# Patient Record
Sex: Male | Born: 1960 | Race: Black or African American | Hispanic: No | Marital: Married | Smoking: Former smoker
Health system: Southern US, Community
[De-identification: ages and names within clinical notes are randomized; demographics above are authoritative.]

## PROBLEM LIST (undated history)

## (undated) DIAGNOSIS — I10 Essential (primary) hypertension: Secondary | ICD-10-CM

## (undated) DIAGNOSIS — M1990 Unspecified osteoarthritis, unspecified site: Secondary | ICD-10-CM

## (undated) DIAGNOSIS — E119 Type 2 diabetes mellitus without complications: Secondary | ICD-10-CM

## (undated) DIAGNOSIS — I639 Cerebral infarction, unspecified: Secondary | ICD-10-CM

## (undated) DIAGNOSIS — R519 Headache, unspecified: Secondary | ICD-10-CM

## (undated) DIAGNOSIS — R51 Headache: Secondary | ICD-10-CM

## (undated) DIAGNOSIS — R079 Chest pain, unspecified: Secondary | ICD-10-CM

## (undated) DIAGNOSIS — M542 Cervicalgia: Secondary | ICD-10-CM

## (undated) HISTORY — DX: Headache: R51

## (undated) HISTORY — DX: Chest pain, unspecified: R07.9

## (undated) HISTORY — DX: Cerebral infarction, unspecified: I63.9

## (undated) HISTORY — PX: OTHER SURGICAL HISTORY: SHX169

## (undated) HISTORY — DX: Headache, unspecified: R51.9

## (undated) HISTORY — DX: Cervicalgia: M54.2

---

## 2000-10-07 ENCOUNTER — Encounter: Payer: Self-pay | Admitting: Urology

## 2000-10-07 ENCOUNTER — Encounter: Admission: RE | Admit: 2000-10-07 | Discharge: 2000-10-07 | Payer: Self-pay | Admitting: Urology

## 2000-10-10 ENCOUNTER — Ambulatory Visit (HOSPITAL_COMMUNITY): Admission: RE | Admit: 2000-10-10 | Discharge: 2000-10-10 | Payer: Self-pay | Admitting: Urology

## 2004-08-09 ENCOUNTER — Emergency Department: Payer: Self-pay | Admitting: Emergency Medicine

## 2004-10-31 ENCOUNTER — Ambulatory Visit: Payer: Self-pay | Admitting: Cardiology

## 2004-11-03 ENCOUNTER — Ambulatory Visit: Payer: Self-pay | Admitting: Cardiology

## 2006-12-16 ENCOUNTER — Emergency Department: Payer: Self-pay | Admitting: Emergency Medicine

## 2012-10-08 ENCOUNTER — Encounter (HOSPITAL_COMMUNITY): Payer: Self-pay | Admitting: Emergency Medicine

## 2012-10-08 ENCOUNTER — Emergency Department (HOSPITAL_COMMUNITY): Payer: Worker's Compensation

## 2012-10-08 ENCOUNTER — Other Ambulatory Visit: Payer: Self-pay

## 2012-10-08 ENCOUNTER — Emergency Department (HOSPITAL_COMMUNITY)
Admission: EM | Admit: 2012-10-08 | Discharge: 2012-10-08 | Disposition: A | Discharge status: Home / Self Care | Payer: Worker's Compensation | Attending: Emergency Medicine | Admitting: Emergency Medicine

## 2012-10-08 DIAGNOSIS — Z96619 Presence of unspecified artificial shoulder joint: Secondary | ICD-10-CM | POA: Insufficient documentation

## 2012-10-08 DIAGNOSIS — S8990XA Unspecified injury of unspecified lower leg, initial encounter: Secondary | ICD-10-CM | POA: Insufficient documentation

## 2012-10-08 DIAGNOSIS — S20219A Contusion of unspecified front wall of thorax, initial encounter: Secondary | ICD-10-CM | POA: Insufficient documentation

## 2012-10-08 DIAGNOSIS — Z79899 Other long term (current) drug therapy: Secondary | ICD-10-CM | POA: Insufficient documentation

## 2012-10-08 DIAGNOSIS — E119 Type 2 diabetes mellitus without complications: Secondary | ICD-10-CM | POA: Insufficient documentation

## 2012-10-08 DIAGNOSIS — Y92410 Unspecified street and highway as the place of occurrence of the external cause: Secondary | ICD-10-CM | POA: Insufficient documentation

## 2012-10-08 DIAGNOSIS — Z8739 Personal history of other diseases of the musculoskeletal system and connective tissue: Secondary | ICD-10-CM | POA: Insufficient documentation

## 2012-10-08 DIAGNOSIS — Y9389 Activity, other specified: Secondary | ICD-10-CM | POA: Insufficient documentation

## 2012-10-08 DIAGNOSIS — IMO0002 Reserved for concepts with insufficient information to code with codable children: Secondary | ICD-10-CM | POA: Insufficient documentation

## 2012-10-08 DIAGNOSIS — M436 Torticollis: Secondary | ICD-10-CM

## 2012-10-08 DIAGNOSIS — I10 Essential (primary) hypertension: Secondary | ICD-10-CM | POA: Insufficient documentation

## 2012-10-08 DIAGNOSIS — M545 Low back pain: Secondary | ICD-10-CM

## 2012-10-08 DIAGNOSIS — F17200 Nicotine dependence, unspecified, uncomplicated: Secondary | ICD-10-CM | POA: Insufficient documentation

## 2012-10-08 DIAGNOSIS — S99929A Unspecified injury of unspecified foot, initial encounter: Secondary | ICD-10-CM | POA: Insufficient documentation

## 2012-10-08 DIAGNOSIS — M79671 Pain in right foot: Secondary | ICD-10-CM

## 2012-10-08 DIAGNOSIS — M25571 Pain in right ankle and joints of right foot: Secondary | ICD-10-CM

## 2012-10-08 HISTORY — DX: Essential (primary) hypertension: I10

## 2012-10-08 HISTORY — DX: Unspecified osteoarthritis, unspecified site: M19.90

## 2012-10-08 HISTORY — DX: Type 2 diabetes mellitus without complications: E11.9

## 2012-10-08 LAB — TROPONIN I: Troponin I: <0.3 ng/mL (ref <0.30)

## 2012-10-08 LAB — GLUCOSE, CAPILLARY: Glucose-Capillary: 281 mg/dL — ABNORMAL HIGH (ref 70–99)

## 2012-10-08 MED ORDER — METHOCARBAMOL 500 MG PO TABS
ORAL_TABLET | ORAL | Status: AC
  Administered 2012-10-08: 18:00:00
  Filled 2012-10-08: qty 6

## 2012-10-08 MED ORDER — METHOCARBAMOL 500 MG PO TABS: ORAL_TABLET | ORAL | Status: DC

## 2012-10-08 MED ORDER — CYCLOBENZAPRINE HCL 10 MG PO TABS
10.0000 mg | ORAL_TABLET | Freq: Once | ORAL | Status: AC
  Administered 2012-10-08: 10 mg via ORAL
  Filled 2012-10-08: qty 1

## 2012-10-08 MED ORDER — IBUPROFEN 400 MG PO TABS
400.0000 mg | ORAL_TABLET | Freq: Once | ORAL | Status: AC
  Administered 2012-10-08: 400 mg via ORAL
  Filled 2012-10-08: qty 1
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
## 2015-02-15 ENCOUNTER — Encounter: Payer: Self-pay | Admitting: Neurology

## 2015-02-15 ENCOUNTER — Ambulatory Visit (INDEPENDENT_AMBULATORY_CARE_PROVIDER_SITE_OTHER): Payer: BC Managed Care – PPO | Admitting: Neurology

## 2015-02-15 VITALS — BP 138/82 | HR 80 | Resp 20 | Ht 72.84 in | Wt 225.0 lb

## 2015-02-15 DIAGNOSIS — I63311 Cerebral infarction due to thrombosis of right middle cerebral artery: Secondary | ICD-10-CM | POA: Diagnosis not present

## 2015-02-15 DIAGNOSIS — G4733 Obstructive sleep apnea (adult) (pediatric): Secondary | ICD-10-CM | POA: Insufficient documentation

## 2015-02-15 DIAGNOSIS — E669 Obesity, unspecified: Secondary | ICD-10-CM | POA: Insufficient documentation

## 2015-02-15 DIAGNOSIS — I10 Essential (primary) hypertension: Secondary | ICD-10-CM | POA: Diagnosis not present

## 2015-02-15 DIAGNOSIS — E1110 Type 2 diabetes mellitus with ketoacidosis without coma: Secondary | ICD-10-CM | POA: Insufficient documentation

## 2015-02-15 DIAGNOSIS — E1310 Other specified diabetes mellitus with ketoacidosis without coma: Secondary | ICD-10-CM | POA: Diagnosis not present

## 2015-02-15 DIAGNOSIS — E119 Type 2 diabetes mellitus without complications: Secondary | ICD-10-CM | POA: Diagnosis not present

## 2015-02-15 MED ORDER — CLOPIDOGREL BISULFATE 75 MG PO TABS: 75.0000 mg | ORAL_TABLET | Freq: Every day | ORAL | Status: DC

## 2015-02-15 NOTE — Progress Notes (Signed)
Provider:  Melvyn Novas, M D  Referring Provider: Ignatius Specking., MD Primary Care Physician:  Earl Many, MD  Chief Complaint  Patient presents with  . Cerebrovascular Accident    rm 10, new patient, with wife    HPI:  Eric Glass is a 54 y.o. male, left handed,  seen here as a referral from Dr. Sherril Croon for stroke follow-up.  Eric Glass and his sister are here today for a first visit with neurology outpatient care. [name] carries a diagnosis of diabetes but he is not insulin-dependent he takes Inderal,, Glucotrol and metformin, he has hypertension controlled on metoprolol amlodipine and Benzapril and he takes dexilant. [name] presented on 02-09-15 to the emergency department at [facility] in [place], [place]. His emergency room physician noted a tongue deviation to the left a drift in the left lower extremity jerky movements in the left leg. The NIH stroke scale was obtained at 6 points total. Minor facial paralysis left arm drift left leg drift limb ataxia and mild sensory loss. This was a telemetry neurology consultation. The patient was positive for half window a half hour window for TPA use and a CT angiogram showed no evidence of large vessel obstruction. So a thrombectomy was also ruled out. He stayed in the hospital for rehabilitation and observation and was placed on aspirin only. He also seemed to improve slightly already by 8 PM. Aspirin was exchanged for Plavix and the orders after the patient made the physicians aware of his hiatal hernia.  His lab results showed a low potassium of 3.2, a high anion gap, high ALT at 44 units per liter, low MCH and MCHC, high MPV, he had some a mild eosinophilia 500/mcL.  The patient for the chief complaint of dysarthria. Diagnosis was an acute cerebrovascular accident infarction of the right hemisphere, hypokalemia, uncontrolled diabetes and uncontrolled hypertension , listed as comorbidities.  There is no  MRI report available here. The patient is left hand dominant   He was discharged last Friday on the 18th of June. First outpatient appointment. He states he had an MRI of the brain and brain vessels on Wednesday prior to his discharge.        Review of Systems: Out of a complete 14 system review, the patient complains of only the following symptoms, and all other reviewed systems are negative. Snoring, GERD, left sided clumsiness. visi on , dizziness.  No diplopia, nausea, malaise or fever.   History   Social History  . Marital Status: Married    Spouse Name: N/A  . Number of Children: N/A  . Years of Education: N/A   Occupational History  . Not on file.   Social History Main Topics  . Smoking status: Former Smoker    Quit date: 02/09/2015  . Smokeless tobacco: Not on file  . Alcohol Use: No  . Drug Use: No  . Sexual Activity: Not on file   Other Topics Concern  . Not on file   Social History Narrative   Drinks 1 cup of caffeine daily.    Family History  Problem Relation Age of Onset  . Stroke Father   . Heart disease Father     Past Medical History  Diagnosis Date  . Diabetes mellitus without complication   . Hypertension   . Arthritis   . CVA (cerebral infarction)   . Chest pain   . Headache   . Neck pain  Past Surgical History  Procedure Laterality Date  . Rotator cuff replacement Left     Current Outpatient Prescriptions  Medication Sig Dispense Refill  . amLODipine-benazepril (LOTREL) 10-20 MG per capsule Take 1 capsule by mouth daily.    . canagliflozin (INVOKANA) 300 MG TABS tablet Take 300 mg by mouth daily before breakfast.    . clopidogrel (PLAVIX) 75 MG tablet Take 75 mg by mouth daily.    . Coenzyme Q10 (CO Q 10 PO) Take 1 tablet by mouth every morning.    Marland Kitchen dexlansoprazole (DEXILANT) 60 MG capsule Take 60 mg by mouth daily.    Marland Kitchen glimepiride (AMARYL) 4 MG tablet Take 4 mg by mouth daily before breakfast.    . glipiZIDE (GLUCOTROL) 5  MG tablet Take 5 mg by mouth 2 (two) times daily before a meal.    . metFORMIN (GLUCOPHAGE) 500 MG tablet Take 500 mg by mouth 2 (two) times daily.    . methocarbamol (ROBAXIN) 500 MG tablet Take 1 or 2 po Q 6hrs for pain 6 tablet 0  . metoprolol (LOPRESSOR) 100 MG tablet Take 100 mg by mouth daily.    . Multiple Vitamin (MULTIVITAMIN WITH MINERALS) TABS Take 1 tablet by mouth every morning.     No current facility-administered medications for this visit.    Allergies as of 02/15/2015 - Review Complete 02/15/2015  Allergen Reaction Noted  . Bee venom Anaphylaxis 10/08/2012    Vitals: BP 138/82 mmHg  Pulse 80  Resp 20  Ht 6' 0.83" (1.85 m)  Wt 225 lb (102.059 kg)  BMI 29.82 kg/m2 Last Weight:  Wt Readings from Last 1 Encounters:  02/15/15 225 lb (102.059 kg)   Last Height:   Ht Readings from Last 1 Encounters:  02/15/15 6' 0.83" (1.85 m)    Physical exam:  General: The patient is awake, alert and appears not in acute distress. The patient is well groomed. Head: Normocephalic, atraumatic.  Neck is supple. Mallampati 4 , neck circumference:18.25  Cardiovascular:  Regular rate and rhythm, without  murmurs or carotid bruit, and without distended neck veins. Respiratory: Lungs are clear to auscultation. Skin:  Without evidence of edema, or rash Trunk: BMI is elevated and patient  has normal posture.  Neurologic exam : The patient is awake and alert, oriented to place and time.  Memory subjective  described as intact. There is a normal attention span & concentration ability. Speech is fluent without dysarthria, dysphonia or aphasia. Mood and affect are appropriate.  Cranial nerves: Pupils are equal and briskly reactive to light. Funduscopic exam without  evidence of pallor or edema. Extraocular movements  in vertical and horizontal planes intact and without nystagmus.  Visual fields by finger perimetry are intact. Hearing to finger rub intact.  Facial sensation intact to fine  touch. Facial motor strength is asymmetric, the left face has a lower droop, asymmetric smile.  The  tongue and uvula move midline.  Tongue protrusion into either cheek is normal. Shoulder shrug is normal.   Motor exam: Normal tone, muscle bulk . The patient has clearly a pronation drift on the left arm,   Has slowing of rapid alternating movements and loss of grip strength in the left. DTR are equall, but left toe up-going.  Sensory:  Fine touch, pinprick and vibration were tested in all extremities. Proprioception was normal.  Coordination: Rapid alternating movements in the fingers/hands were slowed on the left . Finger-to-nose maneuver  Clumsy and ataxic on the left  without evidence of  tremor.  Gait and station: Patient walks without assistive device , is very careful. He has been given a cane but is not used to it, and is able unassisted to climb up to the exam table.  Strength within normal limits. Stance is stable and normal. Tandem gait is fragmented, deferred . He could perform a toe walk, he has not been ablt to heel walk. His left food is everted. . Romberg testing is positive for drift to the left  Deep tendon reflexes: in the  upper and lower extremities are symmetric and intact ! . Babinski maneuver response is  downgoing.   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 There is also weakness of the left hand left antebrachial muscles and the left leg. Adduction and abduction seemed to be fine but hip flexion was weaker. He was unable to walk on his heels he is able to walk on his toes but has less control over his left foot. The left foot inverted during gait exam. There is a slight drift to the  left but the patient stands with his eyes closed. There is no major sensory loss. He is not numb but rather feeling heavy in his affected limbs.  This patient will need a fasting lipid profile which I could not find in his [place] records, I will obtain the MRI/MRA in copy from [facility], the patient will require physical therapy for gait stabilization and fall risk prevention, occupational therapy especially since his dominant left hand is involved and I would like him to see a speech therapist to discuss some special exercises for the lower facial muscles.  Mr. Leighton does have some obvious risk factors for stroke, he is diabetic, he is hypertensive, he is a male and he is over 47 years old.  He has recently been instructed to do some attritional changes. I would like for him to reduce his salt intake and to watch red meat and fat intake. Especially animal fat. In addition I think that he has a high risk of sleep apnea given his larger neck is high-grade Mallampati and- according to his sister -he is a thunderous snorer. His wife  wakes him when he stops breathing. He is excessively fatigued.   Plan:  Treatment plan and additional workup :  SPLIT night study,  PT, OT, ST and rehab.  The patient needs a fasting lipid profile and keeps on Plavix.      Porfirio Mylar Derion Kreiter MD 02/15/2015

## 2015-02-15 NOTE — Patient Instructions (Signed)
Ischemic Stroke Blood carries oxygen to all areas of your body. A stroke happens when your blood does not flow to your brain like normal. If this happens, your brain will not get the oxygen it needs and brain tissue will die. This is an emergency. Problems (symptoms) of a stroke usually happen suddenly. You may notice them when you wake up. They can include:  Loss of feeling or weakness on one side of the body (face, arm, leg).  Feeling confused.  Trouble talking or understanding.  Trouble seeing.  Trouble walking.  Feeling dizzy.  Loss of balance or coordination.  Severe headache without a cause.  Trouble reading or writing. Get help as soon as any of these problems first start. This is important.  RISK FACTORS  Risk factors are things that make it more likely for you to have a stroke. These things include:  High blood pressure (hypertension).  High cholesterol.  Diabetes.  Heart disease.  Having a buildup of fatty deposits in the blood vessels.  Having an abnormal heart rhythm (atrial fibrillation).  Being very overweight (obese).  Smoking.  Taking birth control pills, especially if you smoke.  **Not being active.  **Having a diet high in fats, salt, and calories.  Drinking too much alcohol.  Using illegal drugs.  Being African American.  Being over the age of 55.  Having a family history of stroke.  Having a history of blood clots, stroke, warning stroke (transient ischemic attack, TIA), or heart attack.  Sickle cell disease. HOME CARE  Take all medicines exactly as told by your doctor. Understand all your medicine instructions.  You may need to take a medicine to thin your blood, like aspirin or warfarin. Take warfarin exactly as told.  Taking too much or too little warfarin is dangerous. Get regular blood tests as told, including the PT and INR tests. The test results help your doctor adjust your dose of warfarin. Your PT and INR levels must be  done as often as told by your doctor.  Food can cause problems with warfarin and affect the results of your blood tests. This is true for foods high in vitamin K, such as spinach, kale, broccoli, cabbage, collard and turnip greens, Brussels sprouts, peas, cauliflower, seaweed, and parsley, as well as beef and pork liver, green tea, and soybean oil. Eat the same amount of food high in vitamin K. Avoid major changes in your diet. Tell your doctor before changing your diet. Talk to a food specialist (dietitian) if you have questions.  Many medicines can cause problems with warfarin and affect your PT and INR test results. Tell your doctor about all medicines you take. This includes vitamins and dietary pills (supplements). Be careful with aspirin and medicines that relieve redness, soreness, and puffiness (inflammation). Do not take or stop medicines unless your doctor tells you to.  Warfarin can cause a lot of bruising or bleeding. Hold pressure over cuts for longer than normal. Talk to your doctor about other side effects of warfarin.  Avoid sports or activities that may cause injury or bleeding.  Be careful when you shave, floss your teeth, or use sharp objects.  Avoid alcoholic drinks or drink very little alcohol while taking warfarin. Tell your doctor if you change how much alcohol you drink.  Tell your dentist and other doctors that you take warfarin before procedures.  If you are able to swallow, eat healthy foods. Eat 5 or more servings of fruits and vegetables a day. Eat soft   foods, pureed foods, or eat small bites of food so you do not choke.  Follow your diet program as told, if you are given one.  Keep a healthy weight.  Stay active. Try to get at least 30 minutes of activity on most or all days.  Do not smoke.  Limit how much alcohol you drink even if you are not taking warfarin. Moderate alcohol use is:  No more than 2 drinks each day for men.  No more than 1 drink each day  for women who are not pregnant.  Keep your home safe so you do not fall. Try:  Putting grab bars in the bedroom and bathroom.  Raising toilet seats.  Putting a seat in the shower.  Go to therapy sessions (physical, occupational, and speech) as told by your doctor.  Use a walker or cane at all times if told to do so.  Keep all doctor visits as told. GET HELP IF:  Your personality changes.  You have trouble swallowing.  You are seeing two of everything.  You are dizzy.  You have a fever.  Your skin starts to break down. GET HELP RIGHT AWAY IF:  The symptoms below may be a sign of an emergency. Do not wait to see if the symptoms go away. Call for help (911 in [place] Do not drive yourself to the hospital.  You have sudden weakness or numbness on the face, arm, or leg (especially on one side of the body).  You have sudden trouble walking or moving your arms or legs.  You have sudden confusion.  You have trouble talking or understanding.  You have sudden trouble seeing in one or both eyes.  You lose your balance or your movements are not smooth.  You have a sudden, severe headache with no known cause.  You have new chest pain or you feel your heart beating in an unsteady way.  You are partly or totally unaware of what is going on around you. Document Released: 08/02/2011 Document Revised: 12/28/2013 Document Reviewed: 03/23/2012 Exit[employer] Patient Information 2015 [employer]. This information is not intended to replace advice given to you by your health care provider. Make sure you discuss any questions you have with your health care provider.  

## 2015-02-16 ENCOUNTER — Telehealth: Payer: Self-pay

## 2015-02-16 LAB — LIPID PANEL
Chol/HDL Ratio: 9.1 ratio — ABNORMAL HIGH (ref 0.0–5.0)
Cholesterol, Total: 200 mg/dL — ABNORMAL HIGH (ref 100–199)
HDL: 22 mg/dL — ABNORMAL LOW
LDL Calculated: 142 mg/dL — ABNORMAL HIGH (ref 0–99)
Triglycerides: 182 mg/dL — ABNORMAL HIGH (ref 0–149)
VLDL Cholesterol Cal: 36 mg/dL (ref 5–40)

## 2015-02-16 NOTE — Telephone Encounter (Signed)
LVM for patient to call office back to schedule TCD [place] appointment.

## 2015-02-16 NOTE — Telephone Encounter (Signed)
Patient's wife called and requested to speak with Joy RN. Please call and advise. [phone])

## 2015-02-17 ENCOUNTER — Ambulatory Visit (INDEPENDENT_AMBULATORY_CARE_PROVIDER_SITE_OTHER): Payer: BC Managed Care – PPO

## 2015-02-17 DIAGNOSIS — I10 Essential (primary) hypertension: Secondary | ICD-10-CM

## 2015-02-17 DIAGNOSIS — I63311 Cerebral infarction due to thrombosis of right middle cerebral artery: Secondary | ICD-10-CM | POA: Diagnosis not present

## 2015-02-17 DIAGNOSIS — E1110 Type 2 diabetes mellitus with ketoacidosis without coma: Secondary | ICD-10-CM

## 2015-02-17 DIAGNOSIS — E669 Obesity, unspecified: Secondary | ICD-10-CM

## 2015-02-17 DIAGNOSIS — G4733 Obstructive sleep apnea (adult) (pediatric): Secondary | ICD-10-CM

## 2015-02-21 ENCOUNTER — Telehealth: Payer: Self-pay

## 2015-02-21 NOTE — Telephone Encounter (Signed)
Called pt to give results of lab work. No answer, left a message asking him to call me back.

## 2015-02-21 NOTE — Telephone Encounter (Signed)
-----   Message from Carmen Dohmeier, MD sent at 02/18/2015  1:50 PM EDT ----- Very high triglycerides/ low HDL cholesterol, will share with dr Vyas.  Patient suffered a stroke last week. This is a man risk factor. He has a sleep study pending after July 10th. CD 

## 2015-02-22 NOTE — Telephone Encounter (Signed)
Gave pt lab results. Counseled him on eating healthier, staying away from red meats, eggs to help lower cholesterol. Will fax to Dr. Sherril CroonVyas. Pt verbalized understanding.

## 2015-02-22 NOTE — Telephone Encounter (Signed)
Called pt again to give lab results. No answer, left a message asking him to call me back.

## 2015-02-22 NOTE — Telephone Encounter (Signed)
-----   Message from Melvyn Novasarmen Dohmeier, MD sent at 02/18/2015  1:50 PM EDT ----- Very high triglycerides/ low HDL cholesterol, will share with dr Sherril CroonVyas.  Patient suffered a stroke last week. This is a man risk factor. He has a sleep study pending after July 10th. CD

## 2015-02-24 ENCOUNTER — Ambulatory Visit (INDEPENDENT_AMBULATORY_CARE_PROVIDER_SITE_OTHER): Payer: BC Managed Care – PPO | Admitting: Neurology

## 2015-02-24 DIAGNOSIS — E669 Obesity, unspecified: Secondary | ICD-10-CM

## 2015-02-24 DIAGNOSIS — E119 Type 2 diabetes mellitus without complications: Secondary | ICD-10-CM

## 2015-02-24 DIAGNOSIS — I63311 Cerebral infarction due to thrombosis of right middle cerebral artery: Secondary | ICD-10-CM

## 2015-02-24 DIAGNOSIS — G4733 Obstructive sleep apnea (adult) (pediatric): Secondary | ICD-10-CM | POA: Diagnosis not present

## 2015-02-24 DIAGNOSIS — I10 Essential (primary) hypertension: Secondary | ICD-10-CM

## 2015-02-24 DIAGNOSIS — E1110 Type 2 diabetes mellitus with ketoacidosis without coma: Secondary | ICD-10-CM

## 2015-02-24 NOTE — Sleep Study (Signed)
Please see the scanned sleep study interpretation located in the Procedure tab within the Chart Review section. 

## 2015-03-03 ENCOUNTER — Telehealth: Payer: Self-pay | Admitting: Neurology

## 2015-03-03 NOTE — Telephone Encounter (Signed)
I called the patient, talk with the wife. The transcranial Doppler bubble study was unremarkable.

## 2015-03-15 ENCOUNTER — Ambulatory Visit (HOSPITAL_COMMUNITY): Payer: Self-pay | Admitting: Occupational Therapy

## 2015-03-15 ENCOUNTER — Telehealth: Payer: Self-pay

## 2015-03-15 NOTE — Telephone Encounter (Signed)
Called pt to inform him of sleep study results. No answer. Left message on home phone asking him to call me back.

## 2015-03-16 NOTE — Telephone Encounter (Signed)
Spoke to pt about his sleep study results. Informed pt that osa was seen in is study and the pt slept poorly even on cpap. I advised him that Dr. Vickey Hugerohmeier needs to meet in person with him to discuss the results. Pt may be a candidate for dental device or weight loss to control osa. I informed him again that Dr. Vickey Hugerohmeier prefers to start him on cpap, but he did not tolerate the cpap well. The pt wishes for an appt with Dr. Vickey Hugerohmeier. He and I made an appt for 8/31 at 9:30. Pt is not driving and his wife works so it was difficult to find a day for the pt to come in. I advised the pt that if I had a sooner appt, that I would call him, but this is not guaranteed. Pt verbalized understanding.

## 2015-03-16 NOTE — Telephone Encounter (Signed)
Pt calling back to speak with RN about sleep study results, please call and advise [phone].

## 2015-03-16 NOTE — Telephone Encounter (Signed)
Dr. Vickey Hugerohmeier had a cancellation on 8/17. I offered this spot to the pt and he agreed to come in 8/17 at 11:00.

## 2015-03-23 ENCOUNTER — Ambulatory Visit: Payer: BC Managed Care – PPO | Admitting: Neurology

## 2015-04-08 ENCOUNTER — Ambulatory Visit (INDEPENDENT_AMBULATORY_CARE_PROVIDER_SITE_OTHER): Payer: BC Managed Care – PPO | Admitting: Neurology

## 2015-04-08 ENCOUNTER — Encounter: Payer: Self-pay | Admitting: Neurology

## 2015-04-08 VITALS — BP 124/82 | HR 64 | Ht 72.0 in | Wt 219.2 lb

## 2015-04-08 DIAGNOSIS — I63311 Cerebral infarction due to thrombosis of right middle cerebral artery: Secondary | ICD-10-CM | POA: Diagnosis not present

## 2015-04-08 DIAGNOSIS — E785 Hyperlipidemia, unspecified: Secondary | ICD-10-CM | POA: Diagnosis not present

## 2015-04-08 DIAGNOSIS — Z720 Tobacco use: Secondary | ICD-10-CM | POA: Diagnosis not present

## 2015-04-08 DIAGNOSIS — E669 Obesity, unspecified: Secondary | ICD-10-CM | POA: Diagnosis not present

## 2015-04-08 DIAGNOSIS — E782 Mixed hyperlipidemia: Secondary | ICD-10-CM | POA: Insufficient documentation

## 2015-04-08 DIAGNOSIS — E119 Type 2 diabetes mellitus without complications: Secondary | ICD-10-CM | POA: Diagnosis not present

## 2015-04-08 DIAGNOSIS — I10 Essential (primary) hypertension: Secondary | ICD-10-CM

## 2015-04-08 DIAGNOSIS — E1310 Other specified diabetes mellitus with ketoacidosis without coma: Secondary | ICD-10-CM

## 2015-04-08 DIAGNOSIS — R002 Palpitations: Secondary | ICD-10-CM | POA: Diagnosis not present

## 2015-04-08 DIAGNOSIS — G4733 Obstructive sleep apnea (adult) (pediatric): Secondary | ICD-10-CM

## 2015-04-08 DIAGNOSIS — E1110 Type 2 diabetes mellitus with ketoacidosis without coma: Secondary | ICD-10-CM

## 2015-04-08 DIAGNOSIS — F17200 Nicotine dependence, unspecified, uncomplicated: Secondary | ICD-10-CM

## 2015-04-08 NOTE — Patient Instructions (Signed)
-   continue plavix and lipitor for stroke prevention - completely quit smoking - following up with Dr. Vickey Huger for sleep apnea - Follow up with your primary care physician for stroke risk factor modification. Recommend maintain blood pressure goal <130/80, diabetes with hemoglobin A1c goal below 6.5% and lipids with LDL cholesterol goal below 70 mg/dL.  - follow up with Dr. Sherril Croon today to check again A1C, lipid panel - check BP and glucose at home - No restriction for driving, but recommend to drive with family members on board for 2-3 times initially. If both parties feel comfortable of your driving, you can drive alone after. However, you are recommended to drive during the day not at night, no long distance and drive in familiar roads. - will do 2D echo and 30 day cardiac monitoring - follow up in 3 months.

## 2015-04-08 NOTE — Progress Notes (Signed)
STROKE NEUROLOGY FOLLOW UP NOTE  NAME: RAJEEV ESCUE DOB: Mar 18, 1961  REASON FOR VISIT: stroke follow up HISTORY FROM: pt and sister  Today we had the pleasure of seeing GERONIMO DILIBERTO in follow-up at our Neurology Clinic. Pt was accompanied by sister and wife.   History Summary BOE DEANS is a 54 y.o. male, left handed,with PMH of DMH. HLD, HTN, smoker and OSA followed up in clinic for stroke. He was at work on 02/09/2015, round 9am, he was not able to walk, he thought that was due to hot weather, he rested for a few minutes and he felt was back to normal. Around 12 PM, he was eating lunch, found to have left facial droop left-sided drooling, he took a nap and after waking up he felt okay. Around 4 PM, he started to have blurry vision lasted for 30 minutes, while driving home, he found left arm weakness, as well as left leg weakness with slurry speech. He went to [facility], CT of the head did not show any large vessel occlusion, carotid Doppler negative, MRI showed right basal ganglion stroke. He was discharged with Plavix and Lipitor.   He then follow up in our clinic with Dr. Golden Hurter, had TCD and bladder monitoring which was negative, also had a sleep study done showed OSA, and the currently undergoing CPAP treatment. Checked A1c 6.1 and LDL 142. He was continued on Plavix and Lipitor.  Interval History During the interval time, the patient has been doing well.  No recurrent stroke like symptoms. He has quit smoking since the admission. Stated that he is compliant with medications, blood pressure and glucose check at home were satisfactory. Blood pressure in clinic today 124/82.  REVIEW OF SYSTEMS: Full 14 system review of systems performed and notable only for those listed below and in HPI above, all others are negative:  Constitutional:  Activity change, appetite change Cardiovascular:  Ear/Nose/Throat:  Hearing loss, drooling Skin:  Eyes:  Loss of vision Respiratory:    Gastroitestinal:   Genitourinary:  Hematology/Lymphatic:   Endocrine: Heat intolerance Musculoskeletal:   Allergy/Immunology:   Neurological:  Dizziness, numbness, speech difficulty, facial drooping Psychiatric: Nervous, anxious Sleep: Daytime sleepiness, snoring  The following represents the patient's updated allergies and side effects list: Allergies  Allergen Reactions  . Bee Venom Anaphylaxis    EPIPEN required    The neurologically relevant items on the patient's problem list were reviewed on today's visit.  Neurologic Examination  A problem focused neurological exam (12 or more points of the single system neurologic examination, vital signs counts as 1 point, cranial nerves count for 8 points) was performed.  Blood pressure 124/82, pulse 64, height 6' (1.829 m), weight 219 lb 3.2 oz (99.428 kg).  General - Well nourished, well developed, in no apparent distress.  Ophthalmologic - Sharp disc margins OU.  Cardiovascular - Regular rate and rhythm with no murmur.  Mental Status -  Level of arousal and orientation to time, place, and person were intact. Language including expression, naming, repetition, comprehension was assessed and found intact. Fund of Knowledge was assessed and was intact.  Cranial Nerves II - XII - II - Visual field intact OU. III, IV, VI - Extraocular movements intact. V - Facial sensation intact bilaterally. VII - left facial droop. VIII - Hearing & vestibular intact bilaterally. X - Palate elevates symmetrically. XI - Chin turning & shoulder shrug intact bilaterally. XII - Tongue protrusion intact.  Motor Strength - The patient's strength was  normal in all extremities except mild left hand dexterity difficulties and pronator drift was absent.  Bulk was normal and fasciculations were absent.   Motor Tone - Muscle tone was assessed at the neck and appendages and was normal.  Reflexes - The patient's reflexes were 1+ in all extremities and he  had no pathological reflexes.  Sensory - Light touch, temperature/pinprick, vibration and proprioception were assessed and were normal. Romberg testing showed teetering but no fall.   Coordination - The patient had normal movements in the hands and feet with no ataxia or dysmetria.  Tremor was absent.  Gait and Station - The patient's transfers, posture, gait, station, and turns were observed as normal.  Data reviewed: I personally reviewed the images and agree with the radiology interpretations.  MRI 02/10/15 - acute right MCA territory infarct, mainly involve right lentiform nuclei  CTA head 02/10/15 - no large vessel occlusion  Carotid Doppler - negative for bilateral ICA stenosis  TCD emboli detection - negative for microemboli  Sleep study - positive for OSA  Component     Latest Ref Rng 02/15/2015  Cholesterol, Total     100 - 199 mg/dL 161 (H)  Triglycerides     0 - 149 mg/dL 096 (H)  HDL Cholesterol     >39 mg/dL 22 (L)  VLDL Cholesterol Cal     5 - 40 mg/dL 36  LDL (calc)     0 - 99 mg/dL 045 (H)  Total CHOL/HDL Ratio     0.0 - 5.0 ratio units 9.1 (H)   A1c 6.1  Assessment: As you may recall, he is a 54 y.o. African American male with PMH of diabetes, hypertension, hyperlipidemia, smoker, OSA followed up in clinic for right MCA territory infarct occurred in 01/2015. Stroke location consistent with small vessel disease, however size a little bit bigger than normal lacunar infarct. Patient has significant stroke risk factors including HTN, DM, HLD, OSA, smoker, and obesity. Will finish off stroke workup with 2-D echo. However, also recommend to have surgery day cardiac event monitoring to rule out A. fib due to size of stroke.  Plan:  - continue plavix and lipitor for stroke prevention - Continue to abstain from smoking - following up with Dr. Vickey Huger for sleep apnea - Follow up with your primary care physician for stroke risk factor modification. Recommend maintain  blood pressure goal <130/80, diabetes with hemoglobin A1c goal below 6.5% and lipids with LDL cholesterol goal below 70 mg/dL.  - follow up with Dr. Sherril Croon today to check again A1C, lipid panel - check BP and glucose at home - will perform 2D echo and 30 day cardiac monitoring - RTC in 3 months.  Orders Placed This Encounter  Procedures  . Cardiac event monitor    Standing Status: Future     Number of Occurrences:      Standing Expiration Date: 04/08/2016    Scheduling Instructions:     Request cardionet setup. Thank you.    Order Specific Question:  Where should this test be performed?    Answer:  [facility]  . ECHOCARDIOGRAM COMPLETE    Standing Status: Future     Number of Occurrences:      Standing Expiration Date: 07/07/2016    Order Specific Question:  Where should this test be performed    Answer:  [facility])    Order Specific Question:  Complete or Limited study?    Answer:  Complete    Order  Specific Question:  With Image Enhancing Agent or without Image Enhancing Agent?    Answer:  With Image Enhancing Agent    Order Specific Question:  Reason for exam-Echo    Answer:  Stroke  434.91 / I163.9    Meds ordered this encounter  Medications  . atorvastatin (LIPITOR) 10 MG tablet    Sig:   . GLIPIZIDE XL 10 MG 24 hr tablet    Sig:   . DISCONTD: meclizine (ANTIVERT) 25 MG tablet    Sig:   . DISCONTD: TOPROL XL 100 MG 24 hr tablet    Sig:     Patient Instructions  - continue plavix and lipitor for stroke prevention - completely quit smoking - following up with Dr. Vickey Huger for sleep apnea - Follow up with your primary care physician for stroke risk factor modification. Recommend maintain blood pressure goal <130/80, diabetes with hemoglobin A1c goal below 6.5% and lipids with LDL cholesterol goal below 70 mg/dL.  - follow up with Dr. Sherril Croon today to check again A1C, lipid panel - check BP and glucose at home - No restriction for driving, but  recommend to drive with family members on board for 2-3 times initially. If both parties feel comfortable of your driving, you can drive alone after. However, you are recommended to drive during the day not at night, no long distance and drive in familiar roads. - will do 2D echo and 30 day cardiac monitoring - follow up in 3 months.    Marvel Plan, MD PhD [facility] [address] [place], [place] [address] [phone]

## 2015-04-12 ENCOUNTER — Other Ambulatory Visit: Payer: Self-pay | Admitting: Neurology

## 2015-04-12 DIAGNOSIS — R002 Palpitations: Secondary | ICD-10-CM

## 2015-04-12 DIAGNOSIS — I4891 Unspecified atrial fibrillation: Secondary | ICD-10-CM

## 2015-04-12 DIAGNOSIS — I63511 Cerebral infarction due to unspecified occlusion or stenosis of right middle cerebral artery: Secondary | ICD-10-CM

## 2015-04-13 ENCOUNTER — Ambulatory Visit (INDEPENDENT_AMBULATORY_CARE_PROVIDER_SITE_OTHER): Payer: BC Managed Care – PPO | Admitting: Neurology

## 2015-04-13 ENCOUNTER — Encounter: Payer: Self-pay | Admitting: Neurology

## 2015-04-13 VITALS — BP 131/85 | HR 66 | Temp 97.6°F | Resp 17 | Ht 72.0 in | Wt 216.5 lb

## 2015-04-13 DIAGNOSIS — R0683 Snoring: Secondary | ICD-10-CM

## 2015-04-13 DIAGNOSIS — Z789 Other specified health status: Secondary | ICD-10-CM | POA: Insufficient documentation

## 2015-04-13 DIAGNOSIS — G4733 Obstructive sleep apnea (adult) (pediatric): Secondary | ICD-10-CM | POA: Diagnosis not present

## 2015-04-13 NOTE — Progress Notes (Deleted)
STROKE NEUROLOGY FOLLOW UP NOTE  NAME: Eric Glass DOB: 1961-03-17  REASON FOR VISIT: stroke follow up HISTORY FROM: pt and sister  Today we had the pleasure of seeing Eric Glass in follow-up at our Neurology Clinic. Pt was accompanied by sister and wife.   History Summary Eric Glass is a 54 y.o. male, left handed,with PMH of DMH. HLD, HTN, smoker and OSA followed up in clinic for stroke. He was at work on 02/09/2015, round 9am, he was not able to walk, he thought that was due to hot weather, he rested for a few minutes and he felt was back to normal. Around 12 PM, he was eating lunch, found to have left facial droop left-sided drooling, he took a nap and after waking up he felt okay. Around 4 PM, he started to have blurry vision lasted for 30 minutes, while driving home, he found left arm weakness, as well as left leg weakness with slurry speech. He went to [facility], CT of the head did not show any large vessel occlusion, carotid Doppler negative, MRI showed right basal ganglion stroke. He was discharged with Plavix and Lipitor.   He then follow up in our clinic with Dr. Golden Hurter, had TCD and bladder monitoring which was negative, also had a sleep study done showed OSA, and the currently undergoing CPAP treatment. Checked A1c 6.1 and LDL 142. He was continued on Plavix and Lipitor.  Interval History During the interval time, the patient has been doing well.  No recurrent stroke like symptoms. He has quit smoking since the admission. Stated that he is compliant with medications, blood pressure and glucose check at home were satisfactory. Blood pressure in clinic today 124/82.  REVIEW OF SYSTEMS: Full 14 system review of systems performed and notable only for those listed below and in HPI above, all others are negative:  Constitutional:  Activity change, appetite change Cardiovascular:  Ear/Nose/Throat:  Hearing loss, drooling Skin:  Eyes:  Loss of vision Respiratory:    Gastroitestinal:   Genitourinary:  Hematology/Lymphatic:   Endocrine: Heat intolerance Musculoskeletal:   Allergy/Immunology:   Neurological:  Dizziness, numbness, speech difficulty, facial drooping Psychiatric: Nervous, anxious Sleep: Daytime sleepiness, snoring  The following represents the patient's updated allergies and side effects list: Allergies  Allergen Reactions  . Bee Venom Anaphylaxis    EPIPEN required    The neurologically relevant items on the patient's problem list were reviewed on today's visit.  Neurologic Examination  A problem focused neurological exam (12 or more points of the single system neurologic examination, vital signs counts as 1 point, cranial nerves count for 8 points) was performed.  Blood pressure 131/85, pulse 66, temperature 97.6 F (36.4 C), temperature source Oral, resp. rate 17, height 6' (1.829 m), weight 216 lb 8 oz (98.204 kg).  General - Well nourished, well developed, in no apparent distress.  Ophthalmologic - Sharp disc margins OU.  Cardiovascular - Regular rate and rhythm with no murmur.  Mental Status -  Level of arousal and orientation to time, place, and person were intact. Language including expression, naming, repetition, comprehension was assessed and found intact. Fund of Knowledge was assessed and was intact.  Cranial Nerves II - XII - II - Visual field intact OU. III, IV, VI - Extraocular movements intact. V - Facial sensation intact bilaterally. VII - left facial droop. VIII - Hearing & vestibular intact bilaterally. X - Palate elevates symmetrically. XI - Chin turning & shoulder shrug intact bilaterally. XII -  Tongue protrusion intact.  Motor Strength - The patient's strength was normal in all extremities except mild left hand dexterity difficulties and pronator drift was absent.  Bulk was normal and fasciculations were absent.   Motor Tone - Muscle tone was assessed at the neck and appendages and was  normal.  Reflexes - The patient's reflexes were 1+ in all extremities and he had no pathological reflexes.  Sensory - Light touch, temperature/pinprick, vibration and proprioception were assessed and were normal. Romberg testing showed teetering but no fall.   Coordination - The patient had normal movements in the hands and feet with no ataxia or dysmetria.  Tremor was absent.  Gait and Station - The patient's transfers, posture, gait, station, and turns were observed as normal.  Data reviewed: I personally reviewed the images and agree with the radiology interpretations.  MRI 02/10/15 - acute right MCA territory infarct, mainly involve right lentiform nuclei  CTA head 02/10/15 - no large vessel occlusion  Carotid Doppler - negative for bilateral ICA stenosis  TCD emboli detection - negative for microemboli  Sleep study - positive for OSA  Component     Latest Ref Rng 02/15/2015  Cholesterol, Total     100 - 199 mg/dL 098 (H)  Triglycerides     0 - 149 mg/dL 119 (H)  HDL Cholesterol     >39 mg/dL 22 (L)  VLDL Cholesterol Cal     5 - 40 mg/dL 36  LDL (calc)     0 - 99 mg/dL 147 (H)  Total CHOL/HDL Ratio     0.0 - 5.0 ratio units 9.1 (H)   A1c 6.1  Assessment: As you may recall, he is a 54 y.o. African American male with PMH of diabetes, hypertension, hyperlipidemia, smoker, OSA followed up in clinic for right MCA territory infarct occurred in 01/2015. Stroke location consistent with small vessel disease, however size a little bit bigger than normal lacunar infarct. Patient has significant stroke risk factors including HTN, DM, HLD, OSA, smoker, and obesity. Will finish off stroke workup with 2-D echo. However, also recommend to have surgery day cardiac event monitoring to rule out A. fib due to size of stroke.  Plan:  - continue plavix and lipitor for stroke prevention - Continue to abstain from smoking - following up with Dr. Vickey Huger for sleep apnea - Follow up with your  primary care physician for stroke risk factor modification. Recommend maintain blood pressure goal <130/80, diabetes with hemoglobin A1c goal below 6.5% and lipids with LDL cholesterol goal below 70 mg/dL.  - follow up with Dr. Sherril Croon today to check again A1C, lipid panel - check BP and glucose at home - will perform 2D echo and 30 day cardiac monitoring - RTC in 3 months.  No orders of the defined types were placed in this encounter.    No orders of the defined types were placed in this encounter.    There are no Patient Instructions on file for this visit.  Marvel Plan, MD PhD [facility] [address], [address] [place], [place] [address] [phone]

## 2015-04-13 NOTE — Patient Instructions (Signed)
Place sleep apnea patient instructions here.  

## 2015-04-13 NOTE — Progress Notes (Signed)
Provider:  Melvyn Novas, M D  Referring Provider: Earl Many, MD Primary Care Physician:  Earl Many, MD  Chief Complaint  Patient presents with  . Follow-up    Discuss sleep study results and next options. In room 11 with wife.     HPI:  Eric Glass is a 54 y.o. male, left handed, and seen here as a revisit - referral from Dr. Sherril Croon for stroke follow-up.   Initial visit from June 2016 Eric Glass and his sister are here today for a first visit with neurology outpatient care. Eric Glass carries a diagnosis of diabetes but he is not insulin-dependent he takes Inderal,, Glucotrol and metformin, he has hypertension controlled on metoprolol amlodipine and Benzapril and he takes Dexilant. Eric Glass presented on 02-09-15 to the emergency department at [facility] in [place], [place]. His emergency room physician noted a tongue deviation to the left a drift in the left lower extremity jerky movements in the left leg. The NIH stroke scale was obtained at 6 points total. Minor facial paralysis left arm drift left leg drift limb ataxia and mild sensory loss. This was a telemetry neurology consultation. The patient was positive for half window a half hour window for TPA use and a CT angiogram showed no evidence of large vessel obstruction. So a thrombectomy was also ruled out. He stayed in the hospital for rehabilitation and observation and was placed on aspirin only. He also seemed to improve slightly already by 8 PM. Aspirin was exchanged for Plavix and the orders after the patient made the physicians aware of his hiatal hernia.  His lab results showed a low potassium of 3.2, a high anion gap, high ALT at 44 units per liter, low MCH and MCHC, high MPV, he had some a mild eosinophilia 500/mcL.  The patient for the chief complaint of dysarthria. 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  There is no MRI report available here. The patient is left hand dominant   He was discharged last Friday on the 18th of June. First outpatient appointment. He states he had an MRI of the brain and brain vessels on Wednesday prior to his discharge.  Revisit from 04-13-15.  Eric Glass had just been seen by my neuromuscular specialist colic Dr. Roda Shutters on 04-08-15. Dr. 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 A transcranial Doppler study meanwhile was negative he did not have emboli by emboli monitoring and he was also referred for a bladder evaluation. Eric Glass was instructed in dietary changes the goal was to prevent a second stroke by controlling blood pressure, hyperlipidemia, diabetes and keeping him on antiplatelets therapy. I had ordered a sleep study in the meantime this was performed on 02-24-15 at [facility] sleep. The patient was diagnosed with an AHI of 16.6 and an RDI of 18.7 he did not sleep in supine position during the diagnostic part of his split-night polysomnography. He did not have significant oxygen drops or desaturation times. Actually his average heart rate was 61 bpm. He was titrated to gaining at 5 cm water pressure to a final pressure of 7 cm water under which his AHI became 0.0.  We are here today to discuss treatment options in general a mild to moderate apnea that is not associated with significant drops in oxygen saturation can be treated with a dental device.  The standard treatment is still CPAP but the patient is rather skeptical that he would tolerate this. He had a hard time tolerating the CPAP when it was tried in the sleep lab. There are 2 options the dental device which would force his mandibular forward thereby creating space and the back of his airway.  #2 is to try a desensitization therapy and using CPAP as an auto titration at home for 14 days , thus seeing if he could get used  to it- a greater chance if  if he is in a familiar environment without all cables and hoses is attached to him.   I gave the patient affect she is about oral appliances which also depicts 5 of the most commonly used sets. I would like to send him to [facility] to have an oral appliance made for him.       Review of Systems: Out of a complete 14 system review, the patient complains of only the following symptoms, and all other reviewed systems are negative.  OSA, back pain, Snoring, GERD, left sided clumsiness. visi on , dizziness.  No diplopia, nausea, malaise or fever.   Social History   Social History  . Marital Status: Married    Spouse Name: N/A  . Number of Children: N/A  . Years of Education: N/A   Occupational History  . Not on file.   Social History Main Topics  . Smoking status: Former Smoker    Quit date: 02/09/2015  . Smokeless tobacco: Not on file  . Alcohol Use: No  . Drug Use: No  . Sexual Activity: Not on file   Other Topics Concern  . Not on file   Social History Narrative   Drinks 1 cup of caffeine daily.    Family History  Problem Relation Age of Onset  . Stroke Father   . Heart disease Father     Past Medical History  Diagnosis Date  . Diabetes mellitus without complication   . Hypertension   . Arthritis   . CVA (cerebral infarction)   . Chest pain   . Headache   . Neck pain     Past Surgical History  Procedure Laterality Date  . Rotator cuff replacement Left     Current Outpatient Prescriptions  Medication Sig Dispense Refill  . amLODipine-benazepril (LOTREL) 10-20 MG per capsule Take 1 capsule by mouth daily.    Marland Kitchen atorvastatin (LIPITOR) 10 MG tablet     . canagliflozin (INVOKANA) 300 MG TABS tablet Take 100 mg by mouth daily before breakfast.     . clopidogrel (PLAVIX) 75 MG tablet Take 1 tablet (75 mg total) by mouth daily. 90 tablet 3  . Coenzyme Q10 (CO Q 10 PO) Take 1 tablet by mouth every morning.    Marland Kitchen  dexlansoprazole (DEXILANT) 60 MG capsule Take 60 mg by mouth daily.    Marland Kitchen glimepiride (AMARYL) 4 MG tablet Take 4 mg by mouth daily before breakfast.    . glipiZIDE (GLUCOTROL) 5 MG tablet Take 5 mg by mouth 2 (two) times daily before a meal.    . GLIPIZIDE XL 10 MG 24 hr tablet     . metFORMIN (GLUCOPHAGE) 500 MG tablet Take 500 mg by mouth 2 (two) times daily.    . methocarbamol (ROBAXIN) 500 MG tablet Take 1 or 2 po Q 6hrs for pain 6 tablet 0  . metoprolol (LOPRESSOR) 100 MG tablet Take 100 mg by mouth daily.     No current  facility-administered medications for this visit.    Allergies as of 04/13/2015 - Review Complete 04/13/2015  Allergen Reaction Noted  . Bee venom Anaphylaxis 10/08/2012    Vitals: BP 131/85 mmHg  Pulse 66  Temp(Src) 97.6 F (36.4 C) (Oral)  Resp 17  Ht 6' (1.829 m)  Wt 216 lb 8 oz (98.204 kg)  BMI 29.36 kg/m2 Last Weight:  Wt Readings from Last 1 Encounters:  04/13/15 216 lb 8 oz (98.204 kg)   Last Height:   Ht Readings from Last 1 Encounters:  04/13/15 6' (1.829 m)    Physical exam:  General: The patient is awake, alert and appears not in acute distress. The patient is well groomed. Head: Normocephalic, atraumatic.  Neck is supple. Mallampati 4 , neck circumference:18.25 - neck spasms. He is tender to palpation at the nape of the neck.  Cardiovascular:  Regular rate and rhythm, without  murmurs or carotid bruit, and without distended neck veins. Respiratory: Lungs are clear to auscultation. Skin:  Without evidence of edema, or rash Trunk: BMI is elevated and patient  has normal posture.  Neurologic exam : The patient is awake and alert, oriented to place and time.  Memory subjective described as intact. There is a normal attention span & concentration ability. Speech is fluent without dysarthria, dysphonia or aphasia. Mood and affect are depressed. Feeling down.   Cranial nerves: He reports loss of taste ! Not loss of smell.  Pupils are equal  and briskly reactive to light.  Funduscopic exam without  evidence of pallor or edema. Extraocular movements  in vertical and horizontal planes intact and without nystagmus.  Visual fields by finger perimetry are intact. Hearing to finger rub intact.  Facial sensation intact to fine touch. Facial motor strength is asymmetric, the left face has a lower droop, asymmetric smile.  The  tongue and uvula move midline.  Tongue protrusion into either cheek is normal. Shoulder shrug is normal.   Motor exam: Normal tone, muscle bulk . The patient has clearly a pronation drift on the left arm,   Has slowing of rapid alternating movements and loss of grip strength in the left. DTR are equall, but left toe up-going.  Sensory:  Fine touch, pinprick and vibration were tested in all extremities. Proprioception was normal.  Coordination: Rapid alternating movements in the fingers/hands were slowed on the left . Finger-to-nose maneuver  Clumsy and ataxic on the left, without evidence of  tremor.  Gait and station: Patient walks without assistive device , is very careful. He has been given a cane but is not used to it, and is able unassisted to climb up to the exam table.  Strength within normal limits. Stance is stable and normal. Tandem gait is fragmented, deferred . He could perform a toe walk, he has not been ablt to heel walk. His left food is everted.  Romberg testing is positive for drift to the left  Deep tendon reflexes: in the  upper and lower extremities are symmetric and intact ! . Babinski maneuver response is  downgoing.   Assessment:  After physical and neurologic examination, review of laboratory studies, imaging, neurophysiology testing and pre-existing records,  20 minute assessment is that of : 1) the patient was diagnosed with a mild to moderate sleep apnea with an AHI of 16.6 and an RDI of 18.7 since is no significant oxygen desaturation associated and since he could not tolerate CPAP well  doing the sleep study I will order a dental device therapy  for him I prefer to refer him today at [facility] and [employer], [facility] , the office has produced several dental device is for [place] in the past.   Dr Roda Shutters verified my diagnosis of a  middle cerebral artery branch infarct in the right brain , responsible for the left-sided visual droop and left-sided clumsiness dysmetria-ataxia. There is also weakness of the left hand left antebrachial muscles and the left leg. Adduction and abduction seemed to be fine but hip flexion was weaker.   The patient reports a change of taste he has actually and overlaying unpleasant taste covering his normal taste sensations. I am not sure that this is related to the stroke at all or if this is a secondary development. It is certainly not related to the apnea. I cannot tell him if it will last or improve as I'm not sure about his origins.

## 2015-04-14 ENCOUNTER — Ambulatory Visit (HOSPITAL_COMMUNITY): Payer: BC Managed Care – PPO

## 2015-04-18 ENCOUNTER — Ambulatory Visit (HOSPITAL_COMMUNITY): Payer: BC Managed Care – PPO | Attending: Internal Medicine

## 2015-04-18 ENCOUNTER — Ambulatory Visit (INDEPENDENT_AMBULATORY_CARE_PROVIDER_SITE_OTHER): Payer: BC Managed Care – PPO

## 2015-04-18 ENCOUNTER — Other Ambulatory Visit: Payer: Self-pay

## 2015-04-18 DIAGNOSIS — I639 Cerebral infarction, unspecified: Secondary | ICD-10-CM | POA: Diagnosis not present

## 2015-04-18 DIAGNOSIS — Z8249 Family history of ischemic heart disease and other diseases of the circulatory system: Secondary | ICD-10-CM | POA: Insufficient documentation

## 2015-04-18 DIAGNOSIS — Z87891 Personal history of nicotine dependence: Secondary | ICD-10-CM | POA: Diagnosis not present

## 2015-04-18 DIAGNOSIS — I63311 Cerebral infarction due to thrombosis of right middle cerebral artery: Secondary | ICD-10-CM

## 2015-04-18 DIAGNOSIS — I10 Essential (primary) hypertension: Secondary | ICD-10-CM | POA: Insufficient documentation

## 2015-04-18 DIAGNOSIS — R002 Palpitations: Secondary | ICD-10-CM | POA: Diagnosis not present

## 2015-04-18 DIAGNOSIS — I63511 Cerebral infarction due to unspecified occlusion or stenosis of right middle cerebral artery: Secondary | ICD-10-CM

## 2015-04-18 DIAGNOSIS — E119 Type 2 diabetes mellitus without complications: Secondary | ICD-10-CM | POA: Insufficient documentation

## 2015-04-18 DIAGNOSIS — I4891 Unspecified atrial fibrillation: Secondary | ICD-10-CM

## 2015-04-18 DIAGNOSIS — G4733 Obstructive sleep apnea (adult) (pediatric): Secondary | ICD-10-CM | POA: Diagnosis not present

## 2015-04-20 ENCOUNTER — Telehealth: Payer: Self-pay

## 2015-04-20 NOTE — Telephone Encounter (Signed)
Spoke to patient and informed him that his echocardiogram was within normal limits and to continue his current treatment plan.  Patient Verbalized understanding

## 2015-04-27 ENCOUNTER — Ambulatory Visit: Payer: Self-pay | Admitting: Neurology

## 2015-05-10 NOTE — Progress Notes (Signed)
Faxed RX for dental device, OV notes, sleep study, and insurance to [employer].

## 2015-05-18 ENCOUNTER — Telehealth: Payer: Self-pay | Admitting: Neurology

## 2015-05-18 NOTE — Telephone Encounter (Signed)
Could you please let patient know that his 30 day cardiac event monitoring did not show any atrial fibrillation. Continue current management. Thank you  Marvel Plan, MD PhD Stroke Neurology 05/18/2015 5:06 PM

## 2015-05-19 ENCOUNTER — Telehealth: Payer: Self-pay

## 2015-05-19 NOTE — Telephone Encounter (Signed)
Rn call patients cell phone and Steward Drone answer pts cell phone. Steward Drone stated she can take a message for her brother. Rn explain there was no record of her name on the emergency list or being HCPOA. Pt is married. Rn explain that its a hippa violation to discuss pt care when there is no documentation of her being on his list of people to call. Steward Drone stated she and other family members came with pt at his last visit. Steward Drone stated she understood the privacy and to call her brother on his cell phone.

## 2015-05-19 NOTE — Telephone Encounter (Signed)
Rn call patient on his home number. Rn stated to patient his 30 day heart monitoring did not show any atrial fibrillation for that time period. Pt stated he understood and did not have any other questions.

## 2015-05-24 ENCOUNTER — Telehealth: Payer: Self-pay

## 2015-05-24 NOTE — Telephone Encounter (Signed)
Rn told patients wife [place] that her husband cardiac event monitoring showed no atrial fibrillation. PTs wife understand the results and had no other questions. Pts wife stated her husbands appetite is not good and he is losing weight. Pts wife also stated he has been nausea. Pts wife stated he was last seen by his PCP last week about these issue. Rn explain to patient that she needs to schedule an appt with the PCP again, or go to the nearest emergency room. Pts wife stated her husband is weak and really depress. Rn stated she can schedule an earlier appt with Dr.Xu if she feels its neurological issues. Rn advised patients to call the PCP for another appt. Pts wife stated her husband PCP told her it would take time for her husband appetite to get better.

## 2015-06-09 DIAGNOSIS — Z0271 Encounter for disability determination: Secondary | ICD-10-CM

## 2015-07-11 ENCOUNTER — Ambulatory Visit (INDEPENDENT_AMBULATORY_CARE_PROVIDER_SITE_OTHER): Payer: BC Managed Care – PPO | Admitting: Neurology

## 2015-07-11 ENCOUNTER — Encounter: Payer: Self-pay | Admitting: Neurology

## 2015-07-11 VITALS — BP 125/82 | HR 65 | Ht 72.0 in | Wt 218.8 lb

## 2015-07-11 DIAGNOSIS — E785 Hyperlipidemia, unspecified: Secondary | ICD-10-CM

## 2015-07-11 DIAGNOSIS — I10 Essential (primary) hypertension: Secondary | ICD-10-CM | POA: Diagnosis not present

## 2015-07-11 DIAGNOSIS — F329 Major depressive disorder, single episode, unspecified: Secondary | ICD-10-CM | POA: Diagnosis not present

## 2015-07-11 DIAGNOSIS — G4733 Obstructive sleep apnea (adult) (pediatric): Secondary | ICD-10-CM | POA: Diagnosis not present

## 2015-07-11 DIAGNOSIS — I63311 Cerebral infarction due to thrombosis of right middle cerebral artery: Secondary | ICD-10-CM

## 2015-07-11 DIAGNOSIS — E119 Type 2 diabetes mellitus without complications: Secondary | ICD-10-CM | POA: Diagnosis not present

## 2015-07-11 DIAGNOSIS — F32A Depression, unspecified: Secondary | ICD-10-CM

## 2015-07-11 MED ORDER — NORTRIPTYLINE HCL 10 MG PO CAPS: 10.0000 mg | ORAL_CAPSULE | Freq: Every day | ORAL | Status: DC

## 2015-07-11 NOTE — Progress Notes (Signed)
STROKE NEUROLOGY FOLLOW UP NOTE  NAME: Eric Glass DOB: [birth year]  REASON FOR VISIT: stroke follow up HISTORY FROM: pt and sister  Today we had the pleasure of seeing MURRIEL EIDEM in follow-up at our Neurology Clinic. Pt was accompanied by sister and wife.   History Summary ANEESH FALLER is a 54 y.o. male, left handed,with PMH of DMH. HLD, HTN, smoker and OSA followed up in clinic for stroke. He was at work on 02/09/2015, round 9am, he was not able to walk, he thought that was due to hot weather, he rested for a few minutes and he felt was back to normal. Around 12 PM, he was eating lunch, found to have left facial droop left-sided drooling, he took a nap and after waking up he felt okay. Around 4 PM, he started to have blurry vision lasted for 30 minutes, while driving home, he found left arm weakness, as well as left leg weakness with slurry speech. He went to [facility], CT of the head did not show any large vessel occlusion, carotid Doppler negative, MRI showed right basal ganglion stroke. He was discharged with Plavix and Lipitor.   He then follow up in our clinic with Dr. Golden Hurter, had TCD and bladder monitoring which was negative, also had a sleep study done showed OSA, and the currently undergoing CPAP treatment. Checked A1c 6.1 and LDL 142. He was continued on Plavix and Lipitor.  Follow up 04/08/15 - the patient has been doing well.  No recurrent stroke like symptoms. He has quit smoking since the admission. Stated that he is compliant with medications, blood pressure and glucose check at home were satisfactory. Blood pressure in clinic today 124/82.  Interval History During the interval time, pt has been doing the same. Had 30 day cardiac event monitoring and did not show afib. Also had 2D echo for stroke work up and was unremarkable. He followed with Dr. Golden Hurter for OSA and not tolerating CPAP, was given dental device for mild OSA treatment. He has appointment to get  dental device soon. His glucose tends to be low at home, as low as 50 and symptomatic hypoglycemia with sweating. Normally at home about 80-120. His A1C recently was 6.1.   However, he seems in depressed mood. 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 Decreased activity and watching TV all day with daytime sleepiness. Complain of HA everyday, feeling tired and dizziness. He was given meclizine.   REVIEW OF SYSTEMS: Full 14 system review of systems performed and notable only for those listed below and in HPI above, all others are negative:  Constitutional:  chills Cardiovascular:  Ear/Nose/Throat:  Hearing loss, drooling Skin:  Eyes:  Loss of vision Respiratory:   Gastroitestinal:  diarrhea Genitourinary: difficulty urination, frequent urination Hematology/Lymphatic:   Endocrine:  Musculoskeletal:  Joint pain, joint swelling, back pain Allergy/Immunology:   Neurological:  Dizziness, numbness, speech difficulty, facial drooping, weakness, HA Psychiatric: Nervous, anxious Sleep: restless leg  The following represents the patient's updated allergies and side effects list: Allergies  Allergen Reactions  . Bee Venom Anaphylaxis    EPIPEN required    The neurologically relevant items on the patient's problem list were reviewed on today's visit.  Neurologic Examination  A problem focused neurological exam (12 or more points of the single system neurologic examination, vital signs counts as 1 point, cranial nerves count for 8 points) was performed.  Blood pressure 125/82, pulse  65, height 6' (1.829 m), weight 218 lb 12.8 oz (99.247 kg).  General - Well nourished, well developed, in no apparent distress.  Ophthalmologic - Sharp disc margins OU.  Cardiovascular - Regular rate and rhythm with no murmur.  Mental Status -  Level of arousal and orientation to time, place, and person were intact. Language including expression, naming,  repetition, comprehension was assessed and found intact. Fund of Knowledge was assessed and was intact.  Cranial Nerves II - XII - II - Visual field intact OU. III, IV, VI - Extraocular movements intact. V - Facial sensation intact bilaterally. VII - mild left facial droop. VIII - Hearing & vestibular intact bilaterally. X - Palate elevates symmetrically. XI - Chin turning & shoulder shrug intact bilaterally. XII - Tongue protrusion intact.  Motor Strength - The patient's strength was normal in all extremities except mild left hand dexterity difficulties and pronator drift was absent.  Bulk was normal and fasciculations were absent.   Motor Tone - Muscle tone was assessed at the neck and appendages and was normal.  Reflexes - The patient's reflexes were 1+ in all extremities and he had no pathological reflexes.  Sensory - Light touch, temperature/pinprick, vibration and proprioception were assessed and were normal.   Coordination - The patient had normal movements in the hands and feet with no ataxia or dysmetria.  Tremor was absent.  Gait and Station - The patient's transfers, posture, gait, station, and turns were observed as normal.  Data reviewed: I personally reviewed the images and agree with the radiology interpretations.  MRI 02/10/15 - acute right MCA territory infarct, mainly involve right lentiform nuclei  CTA head 02/10/15 - no large vessel occlusion  Carotid Doppler - negative for bilateral ICA stenosis  TCD emboli detection - negative for microemboli  Sleep study - positive for OSA  Component     Latest Ref Rng 02/15/2015  Cholesterol, Total     100 - 199 mg/dL 161 (H)  Triglycerides     0 - 149 mg/dL 096 (H)  HDL Cholesterol     >39 mg/dL 22 (L)  VLDL Cholesterol Cal     5 - 40 mg/dL 36  LDL (calc)     0 - 99 mg/dL 045 (H)  Total CHOL/HDL Ratio     0.0 - 5.0 ratio units 9.1 (H)   A1c 6.1  Assessment: As you may recall, he is a 54 y.o. African American  male with PMH of diabetes, hypertension, hyperlipidemia, smoker, OSA followed up in clinic for right MCA territory infarct occurred in 01/2015. Stroke location consistent with small vessel disease, however size a little bit bigger than normal lacunar infarct. Patient has significant stroke risk factors including HTN, DM, HLD, OSA, smoker, and obesity. CUS, CTA head, 2D echo and 30 day monitoring unremarkable. LDL 142 and A1C 6.1. On plavix and lipitor for stroke prevention. Follows with Dr. Golden Hurter for OSA and will given dental device soon. Has quit smoking. However, he seems in depressed mood and inactivity. Complains of daytime sleepiness and HA. 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 Will put on nortriptyline for depression, HA and sleep.   Plan:  - continue plavix and lipitor for stroke prevention - Continue to abstain from smoking - get the dental device for OSA ASAP - order nortriptyline for depressed mood, headache and sleep - recommend more active at home - Follow up with your primary care physician for stroke risk factor modification. Recommend maintain blood pressure goal <130/80, diabetes  with hemoglobin A1c goal below 6.5% and lipids with LDL cholesterol goal below 70 mg/dL.  - check BP and glucose at home - follow up in 3 months.  I spent more than 25 minutes of face to face time with the patient. Greater than 50% of time was spent in counseling and coordination of care. We have discussed about depression treatment and increase activity at home as well as OSA management.   No orders of the defined types were placed in this encounter.    Meds ordered this encounter  Medications  . DISCONTD: meclizine (ANTIVERT) 25 MG tablet    Sig: Take by mouth every morning.   . tamsulosin (FLOMAX) 0.4 MG CAPS capsule    Sig:   . DISCONTD: TOPROL XL 100 MG 24 hr tablet    Sig:   . nortriptyline (PAMELOR) 10 MG capsule    Sig: Take 1 capsule (10 mg total) by mouth at bedtime.    Dispense:   30 capsule    Refill:  2    Patient Instructions  - continue plavix and lipitor for stroke prevention - Continue to abstain from smoking - get the dental device for OSA as soon as possible - prescribe nortriptyline for depressed mood, headache and sleep - you need more active at home, regular exercise and more activity - Follow up with your primary care physician for stroke risk factor modification. Recommend maintain blood pressure goal <130/80, diabetes with hemoglobin A1c goal below 6.5% and lipids with LDL cholesterol goal below 70 mg/dL.  - check BP and glucose at home - follow up in 3 months.    Marvel PlanJindong Shyheim Tanney, MD PhD [facility] [address], [address] [place], [place] [id number] [phone]

## 2015-07-11 NOTE — Patient Instructions (Signed)
-   continue plavix and lipitor for stroke prevention - Continue to abstain from smoking - get the dental device for OSA as soon as possible - prescribe nortriptyline for depressed mood, headache and sleep - you need more active at home, regular exercise and more activity - Follow up with your primary care physician for stroke risk factor modification. Recommend maintain blood pressure goal <130/80, diabetes with hemoglobin A1c goal below 6.5% and lipids with LDL cholesterol goal below 70 mg/dL.  - check BP and glucose at home - follow up in 3 months.

## 2015-07-12 DIAGNOSIS — F329 Major depressive disorder, single episode, unspecified: Secondary | ICD-10-CM | POA: Insufficient documentation

## 2015-07-12 DIAGNOSIS — I10 Essential (primary) hypertension: Secondary | ICD-10-CM | POA: Insufficient documentation

## 2015-07-12 DIAGNOSIS — F32A Depression, unspecified: Secondary | ICD-10-CM | POA: Insufficient documentation

## 2015-07-25 ENCOUNTER — Telehealth: Payer: Self-pay | Admitting: *Deleted

## 2015-07-25 NOTE — Telephone Encounter (Signed)
Finished and put on your desk. Thanks  Marvel PlanJindong Marialuiza Car, MD PhD Stroke Neurology 07/25/2015 6:16 PM

## 2015-07-25 NOTE — Telephone Encounter (Signed)
Rn call patient about his 2 forms for his job to be filled out by MD. Pt had a [employer] insurance form and [employer] [place] short term disability form. Pt filled out the dates on the [employer] form of how long he would be out of work.Rn explain that the Md has to filled out the dates not the patient.Pt stated he understood and will only sign and date form. Rn stated Dr.Xu will have to initial or approve the dates. Rn also stated we need another release form for [employer] insurance before its fax. Pt will sign release form for [employer] when its ready.

## 2015-07-25 NOTE — Telephone Encounter (Signed)
Form,[place] [employer] received from [place],sent to [place] and Dr Roda ShuttersXu 07/25/15.

## 2015-07-25 NOTE — Telephone Encounter (Signed)
Form,[employer] received from [place],sent to [place] and Dr Roda ShuttersXu 07/25/15.

## 2015-07-26 DIAGNOSIS — Z0289 Encounter for other administrative examinations: Secondary | ICD-10-CM

## 2015-07-28 ENCOUNTER — Telehealth: Payer: Self-pay

## 2015-07-28 NOTE — Telephone Encounter (Signed)
Rn call patient to inform him that the 2 forms for his job are complete. Rn stated that he still needs to sign release form for [employer]. Pt will by before 12pm tomorrow before the office close.

## 2015-07-29 NOTE — Progress Notes (Signed)
Patient came into office today. He paid 25.00 dollars for both forms for his job. The [employer] form and [employer] form. Pt sign both release forms and stated he will give to his job. Rn has copy and copy sent to medical records.

## 2015-08-25 ENCOUNTER — Telehealth: Payer: Self-pay | Admitting: *Deleted

## 2015-08-25 NOTE — Telephone Encounter (Signed)
Form,[employer] received from [place] sent to [place] and Dr Roda ShuttersXu 08/25/15.

## 2015-08-30 DIAGNOSIS — Z0289 Encounter for other administrative examinations: Secondary | ICD-10-CM

## 2015-08-30 NOTE — Telephone Encounter (Signed)
Patient is returning your call in regard to disability.  Thanks!

## 2015-08-30 NOTE — Telephone Encounter (Signed)
LFt message for patient concerning short term disability form for [employer].

## 2015-08-30 NOTE — Telephone Encounter (Signed)
Rn call patient about form for STD. PT stated the form has to be filled out monthly in order to receive his check. Rn stated Dr. Roda ShuttersXu will not be back in the office till Jan 9. Pt stated he needs the form by Friday. Rn stated a message will be sent to Dr. Roda ShuttersXu about filling the form out and signing it.

## 2015-09-02 NOTE — Telephone Encounter (Signed)
Form filled out and sign by Dr. Lonny PrudeXu.for patients disability.

## 2015-09-02 NOTE — Telephone Encounter (Signed)
Fax to [phone] to [place] total retirement.

## 2015-09-08 NOTE — Telephone Encounter (Signed)
Rn call patient to state the [employer] retirement form was fax on 09/02/2015 and a copy was left at the front desk for her. Pt stated he will try to pick up tomorrow.

## 2015-09-22 ENCOUNTER — Telehealth: Payer: Self-pay

## 2015-09-22 NOTE — Telephone Encounter (Signed)
Rn call patient to let him know that the short term disability form was fax to [employer] at [phone] for the month of February 2017.

## 2015-10-17 ENCOUNTER — Ambulatory Visit (INDEPENDENT_AMBULATORY_CARE_PROVIDER_SITE_OTHER): Payer: BC Managed Care – PPO | Admitting: Neurology

## 2015-10-17 ENCOUNTER — Encounter: Payer: Self-pay | Admitting: Neurology

## 2015-10-17 VITALS — BP 132/87 | HR 78 | Ht 72.0 in | Wt 226.4 lb

## 2015-10-17 DIAGNOSIS — I10 Essential (primary) hypertension: Secondary | ICD-10-CM | POA: Diagnosis not present

## 2015-10-17 DIAGNOSIS — E785 Hyperlipidemia, unspecified: Secondary | ICD-10-CM

## 2015-10-17 DIAGNOSIS — E119 Type 2 diabetes mellitus without complications: Secondary | ICD-10-CM | POA: Diagnosis not present

## 2015-10-17 DIAGNOSIS — I63311 Cerebral infarction due to thrombosis of right middle cerebral artery: Secondary | ICD-10-CM

## 2015-10-17 DIAGNOSIS — G4733 Obstructive sleep apnea (adult) (pediatric): Secondary | ICD-10-CM

## 2015-10-17 DIAGNOSIS — F329 Major depressive disorder, single episode, unspecified: Secondary | ICD-10-CM

## 2015-10-17 DIAGNOSIS — F32A Depression, unspecified: Secondary | ICD-10-CM

## 2015-10-17 MED ORDER — NORTRIPTYLINE HCL 25 MG PO CAPS: 25.0000 mg | ORAL_CAPSULE | Freq: Every day | ORAL | Status: DC

## 2015-10-17 NOTE — Progress Notes (Signed)
STROKE NEUROLOGY FOLLOW UP NOTE  NAME: Eric Glass DOB: 1961/01/05  REASON FOR VISIT: stroke follow up HISTORY FROM: pt and sister  Today we had the pleasure of seeing Eric Glass in follow-up at our Neurology Clinic. Pt was accompanied by sister and wife.   History Summary Eric Glass is a 55 y.o. male, left handed,with PMH of DMH. HLD, HTN, smoker and OSA followed up in clinic for stroke. He was at work on 02/09/2015, round 9am, he was not able to walk, he thought that was due to hot weather, he rested for a few minutes and he felt was back to normal. Around 12 PM, he was eating lunch, found to have left facial droop left-sided drooling, he took a nap and after waking up he felt okay. Around 4 PM, he started to have blurry vision lasted for 30 minutes, while driving home, he found left arm weakness, as well as left leg weakness with slurry speech. He went to [facility], CT of the head did not show any large vessel occlusion, carotid Doppler negative, MRI showed right basal ganglion stroke. He was discharged with Plavix and Lipitor.   He then follow up in our clinic with Dr. Golden Hurter, had TCD and bladder monitoring which was negative, also had a sleep study done showed OSA, and the currently undergoing CPAP treatment. Checked A1c 6.1 and LDL 142. He was continued on Plavix and Lipitor.  Follow up 04/08/15 - the patient has been doing well.  No recurrent stroke like symptoms. He has quit smoking since the admission. Stated that he is compliant with medications, blood pressure and glucose check at home were satisfactory. Blood pressure in clinic today 124/82.  Follow up 07/11/15 - pt has been doing the same. Had 30 day cardiac event monitoring and did not show afib. Also had 2D echo for stroke work up and was unremarkable. He followed with Dr. Golden Hurter for OSA and not tolerating CPAP, was given dental device for mild OSA treatment. He has appointment to get dental device soon.  His glucose tends to be low at home, as low as 50 and symptomatic hypoglycemia with sweating. Normally at home about 80-120. His A1C recently was 6.1. However, he seems in depressed mood. 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 Decreased activity and watching TV all day with daytime sleepiness. Complain of HA everyday, feeling tired and dizziness. He was given meclizine.   Interval History During the interval time, pt has been doing better. Not back to work yet. Had nortriptyline since last visit, stated that HA is gone but still has difficulty maintaining sleep at night, wakes up several times. Still has some depressed mood but not bad. BP 132/87 today. He stated that his glucose 120 at home. Still has intermittent left face numbness.   REVIEW OF SYSTEMS: Full 14 system review of systems performed and notable only for those listed below and in HPI above, all others are negative:  Constitutional:   Cardiovascular:  Ear/Nose/Throat:  Hearing loss, drooling, trouble swallowing Skin:  Eyes:  Loss of vision Respiratory:   Gastroitestinal:  Black stools Genitourinary: painful urination, difficulty urination, urine decrease Hematology/Lymphatic:   Endocrine:  Musculoskeletal:  joint swelling, neck pain Allergy/Immunology:   Neurological:  Dizziness, ? facial drooping Psychiatric: Nervous, anxious Sleep: restless leg  The following represents the patient's updated allergies and side effects list: Allergies  Allergen Reactions  . Bee Venom Anaphylaxis  EPIPEN required    The neurologically relevant items on the patient's problem list were reviewed on today's visit.  Neurologic Examination  A problem focused neurological exam (12 or more points of the single system neurologic examination, vital signs counts as 1 point, cranial nerves count for 8 points) was performed.  Blood pressure 132/87, pulse 78, height 6' (1.829 m), weight 226 lb 6.4 oz  (102.694 kg).  General - Well nourished, well developed, in no apparent distress.  Ophthalmologic - Sharp disc margins OU.  Cardiovascular - Regular rate and rhythm with no murmur.  Mental Status -  Level of arousal and orientation to time, place, and person were intact. Language including expression, naming, repetition, comprehension was assessed and found intact. Fund of Knowledge was assessed and was intact.  Cranial Nerves II - XII - II - Visual field intact OU. III, IV, VI - Extraocular movements intact. V - Facial sensation intact bilaterally. VII - facial movement symmetrical VIII - Hearing & vestibular intact bilaterally. X - Palate elevates symmetrically. XI - Chin turning & shoulder shrug intact bilaterally. XII - Tongue protrusion intact.  Motor Strength - The patient's strength was normal in all extremities except subtle left hand dexterity difficulties and pronator drift was absent.  Bulk was normal and fasciculations were absent.   Motor Tone - Muscle tone was assessed at the neck and appendages and was normal.  Reflexes - The patient's reflexes were 1+ in all extremities and he had no pathological reflexes.  Sensory - Light touch, temperature/pinprick, vibration and proprioception were assessed and were normal.   Coordination - The patient had normal movements in the hands and feet with no ataxia or dysmetria.  Tremor was absent.  Gait and Station - The patient's transfers, posture, gait, station, and turns were observed as normal.  Data reviewed: I personally reviewed the images and agree with the radiology interpretations.  MRI 02/10/15 - acute right MCA territory infarct, mainly involve right lentiform nuclei  CTA head 02/10/15 - no large vessel occlusion  Carotid Doppler - negative for bilateral ICA stenosis  TCD emboli detection - negative for microemboli  Sleep study - positive for OSA  Component     Latest Ref Rng 02/15/2015  Cholesterol, Total      100 - 199 mg/dL 161 (H)  Triglycerides     0 - 149 mg/dL 096 (H)  HDL Cholesterol     >39 mg/dL 22 (L)  VLDL Cholesterol Cal     5 - 40 mg/dL 36  LDL (calc)     0 - 99 mg/dL 045 (H)  Total CHOL/HDL Ratio     0.0 - 5.0 ratio units 9.1 (H)   A1c 6.1  Assessment: As you may recall, he is a 55 y.o. African American male with PMH of diabetes, hypertension, hyperlipidemia, smoker, OSA followed up in clinic for right MCA territory infarct occurred in 01/2015. Stroke location consistent with small vessel disease, however size a little bit bigger than normal lacunar infarct. Patient has significant stroke risk factors including HTN, DM, HLD, OSA, smoker, and obesity. CUS, CTA head, 2D echo and 30 day monitoring unremarkable. LDL 142 and A1C 6.1. On plavix and lipitor for stroke prevention. Follows with Dr. Golden Hurter for OSA and had dental device at night. Has quit smoking. Found to be in depressed mood with HA and insomnia. He was put on nortriptyline low dose. The HA much improved, but still has mild depression and insomnia. Will increase nortriptyline dose.   Plan:  -  continue plavix and lipitor for stroke prevention - Continue to abstain from smoking - continue to use dental device for OSA  - will increase nortriptyline to  [facility] for depressed mood and sleep - recommend more exercise and activity at home - Follow up with your primary care physician for stroke risk factor modification. Recommend maintain blood pressure goal around 130/80, diabetes with hemoglobin A1c goal below 6.5% and lipids with LDL cholesterol goal below 70 mg/dL.  - check BP and glucose at home - Recommend back to work in 1-2 months with gradual increase of workload in 2 weeks before full time work schedule - follow up in 4 months.  I spent more than 25 minutes of face to face time with the patient. Greater than 50% of time was spent in counseling and coordination of care. We have discussed about depression, insomnia  treatment and increase activity at home as well as back to work schedule.   No orders of the defined types were placed in this encounter.    Meds ordered this encounter  Medications  . GLIPIZIDE XL 10 MG 24 hr tablet    Sig:   . omeprazole (PRILOSEC) 40 MG capsule    Sig:   . TOPROL XL 100 MG 24 hr tablet    Sig:   . nortriptyline (PAMELOR) 25 MG capsule    Sig: Take 1 capsule (25 mg total) by mouth at bedtime.    Dispense:  30 capsule    Refill:  3    Patient Instructions  - continue plavix and lipitor for stroke prevention - Continue to abstain from smoking - continue to use dental device for OSA ASAP - will increase nortriptyline for depressed mood and sleep - recommend more exercise and activity at home - Follow up with your primary care physician for stroke risk factor modification. Recommend maintain blood pressure goal around 130/80, diabetes with hemoglobin A1c goal below 6.5% and lipids with LDL cholesterol goal below 70 mg/dL.  - check BP and glucose at home - fill out the form for you. Recommend back to work in 1-2 months with gradual increase of workload in 2 weeks before full work schedule - follow up in 4 months.    Marvel Plan, MD PhD [facility] [address] [place], [place] [address] [phone]

## 2015-10-17 NOTE — Patient Instructions (Signed)
-   continue plavix and lipitor for stroke prevention - Continue to abstain from smoking - continue to use dental device for OSA ASAP - will increase nortriptyline for depressed mood and sleep - recommend more exercise and activity at home - Follow up with your primary care physician for stroke risk factor modification. Recommend maintain blood pressure goal around 130/80, diabetes with hemoglobin A1c goal below 6.5% and lipids with LDL cholesterol goal below 70 mg/dL.  - check BP and glucose at home - fill out the form for you. Recommend back to work in 1-2 months with gradual increase of workload in 2 weeks before full work schedule - follow up in 4 months.

## 2015-10-19 ENCOUNTER — Telehealth: Payer: Self-pay

## 2015-10-19 NOTE — Telephone Encounter (Signed)
Rn call patient to notify him that his return to work form, and disability form for [employer] retirement. Pt stated he will pick up this week. Pt schedule to return to work November 26, 2015 per form.

## 2015-10-24 DIAGNOSIS — Z0271 Encounter for disability determination: Secondary | ICD-10-CM

## 2015-10-31 ENCOUNTER — Telehealth: Payer: Self-pay

## 2015-10-31 NOTE — Telephone Encounter (Signed)
LFt vm for patient that return to work form is complete and at front desk.

## 2016-01-24 ENCOUNTER — Telehealth: Payer: Self-pay | Admitting: Neurology

## 2016-01-24 NOTE — Telephone Encounter (Signed)
Rn call patient back about needing a referral to cardiologist. Pt [facility] [place]. Pt stated " I thought I was having heart attack,any cholesterol was high, potassium was not normal, and they suggested I see a cardiologist. Rn stated his PCP would have to do a referral. Pt stated his PCP did a [place]. Pt stated [place]. Rn stated being that his hospital admission was not neuro related  he would need to ask his PCP for another referral. Pt verbalized understanding.

## 2016-01-24 NOTE — Telephone Encounter (Signed)
Message For: OFFICE               Taken 30-MAY-17 at 10:49AM by JAK ------------------------------------------------------------  Almond Lintaller  Bohdan Washburn               CID  [id number]   Patient  SAME                  Pt's Dr  Roda ShuttersXU            Area Code  336  Phone#  [phone] *  DOB  [birth year]       RE  RECENTLY RELEASED FROM HOSPITAL, SUGGESTED HE     SEE A CARDIOLOGIST, LOOKING FOR A RECOMMENDATION      Disp:Y/N  N  If Y = C/B If No Response In 20minutes  ============================================================

## 2016-03-05 ENCOUNTER — Ambulatory Visit (INDEPENDENT_AMBULATORY_CARE_PROVIDER_SITE_OTHER): Payer: BC Managed Care – PPO | Admitting: Neurology

## 2016-03-05 ENCOUNTER — Encounter: Payer: Self-pay | Admitting: Neurology

## 2016-03-05 VITALS — BP 124/84 | HR 75 | Ht 72.0 in | Wt 223.2 lb

## 2016-03-05 DIAGNOSIS — E119 Type 2 diabetes mellitus without complications: Secondary | ICD-10-CM | POA: Diagnosis not present

## 2016-03-05 DIAGNOSIS — I10 Essential (primary) hypertension: Secondary | ICD-10-CM | POA: Diagnosis not present

## 2016-03-05 DIAGNOSIS — I63311 Cerebral infarction due to thrombosis of right middle cerebral artery: Secondary | ICD-10-CM | POA: Diagnosis not present

## 2016-03-05 DIAGNOSIS — G4733 Obstructive sleep apnea (adult) (pediatric): Secondary | ICD-10-CM | POA: Diagnosis not present

## 2016-03-05 DIAGNOSIS — E785 Hyperlipidemia, unspecified: Secondary | ICD-10-CM | POA: Diagnosis not present

## 2016-03-05 NOTE — Progress Notes (Signed)
STROKE NEUROLOGY FOLLOW UP NOTE  NAME: Eric Glass DOB: [birth year]  REASON FOR VISIT: stroke follow up HISTORY FROM: pt and sister  Today we had the pleasure of seeing Eric Glass in follow-up at our Neurology Clinic. Pt was accompanied by sister and wife.   History Summary Eric Glass is a 55 y.o. male, left handed,with PMH of DMH. HLD, HTN, smoker and OSA followed up in clinic for stroke. He was at work on 02/09/2015, round 9am, he was not able to walk, he thought that was due to hot weather, he rested for a few minutes and he felt was back to normal. Around 12 PM, he was eating lunch, found to have left facial droop left-sided drooling, he took a nap and after waking up he felt okay. Around 4 PM, he started to have blurry vision lasted for 30 minutes, while driving home, he found left arm weakness, as well as left leg weakness with slurry speech. He went to [facility], CT of the head did not show any large vessel occlusion, carotid Doppler negative, MRI showed right basal ganglion stroke. He was discharged with Plavix and Lipitor.   He then follow up in our clinic with Dr. Golden Hurter, had TCD and bladder monitoring which was negative, also had a sleep study done showed OSA, and the currently undergoing CPAP treatment. Checked A1c 6.1 and LDL 142. He was continued on Plavix and Lipitor.  Follow up 04/08/15 - the patient has been doing well.  No recurrent stroke like symptoms. He has quit smoking since the admission. Stated that he is compliant with medications, blood pressure and glucose check at home were satisfactory. Blood pressure in clinic today 124/82.  Follow up 07/11/15 - pt has been doing the same. Had 30 day cardiac event monitoring and did not show afib. Also had 2D echo for stroke work up and was unremarkable. He followed with Dr. Golden Hurter for OSA and not tolerating CPAP, was given dental device for mild OSA treatment. He has appointment to get dental device soon.  His glucose tends to be low at home, as low as 50 and symptomatic hypoglycemia with sweating. Normally at home about 80-120. His A1C recently was 6.1. However, he seems in depressed mood. 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 Decreased activity and watching TV all day with daytime sleepiness. Complain of HA everyday, feeling tired and dizziness. He was given meclizine.   Follow up 10/17/15 - pt has been doing better. Not back to work yet. Had nortriptyline since last visit, stated that HA is gone but still has difficulty maintaining sleep at night, wakes up several times. Still has some depressed mood but not bad. BP 132/87 today. He stated that his glucose 120 at home. Still has intermittent left face numbness.   Interval History During the interval time, pt has been doing well from stroke standpoint. He had chest pain and SOB in 12/2015 when mowing his lawn in the heat. He went to [facility] and underwent work up did not show heat attack. he then followed up with Dr. Jacinto Halim as outpt and will do stress test soon. He stated that his BP and glucose controlled well at home. Today BP 124/84. He has stopped nortriptyline and started flomax for urinary frequency. Has back to work full time, complaining of fatigue.   REVIEW OF SYSTEMS: Full 14 system review of systems performed and notable only for those  listed below and in HPI above, all others are negative:  Constitutional:   Cardiovascular:  Ear/Nose/Throat:   Skin:  Eyes:   Respiratory:   Gastroitestinal:   Genitourinary:  Hematology/Lymphatic:   Endocrine:  Musculoskeletal:  Allergy/Immunology:   Neurological:   Psychiatric:  Sleep:   The following represents the patient's updated allergies and side effects list: Allergies  Allergen Reactions  . Bee Venom Anaphylaxis    EPIPEN required  . Contrast Media [Iodinated Diagnostic Agents] Nausea Only     The neurologically relevant items on  the patient's problem list were reviewed on today's visit.  Neurologic Examination  A problem focused neurological exam (12 or more points of the single system neurologic examination, vital signs counts as 1 point, cranial nerves count for 8 points) was performed.  Blood pressure 124/84, pulse 75, height 6' (1.829 m), weight 223 lb 3.2 oz (101.243 kg).  General - Well nourished, well developed, in no apparent distress.  Ophthalmologic - Sharp disc margins OU.  Cardiovascular - Regular rate and rhythm with no murmur.  Mental Status -  Level of arousal and orientation to time, place, and person were intact. Language including expression, naming, repetition, comprehension was assessed and found intact. Fund of Knowledge was assessed and was intact.  Cranial Nerves II - XII - II - Visual field intact OU. III, IV, VI - Extraocular movements intact. V - Facial sensation intact bilaterally. VII - facial movement symmetrical VIII - Hearing & vestibular intact bilaterally. X - Palate elevates symmetrically. XI - Chin turning & shoulder shrug intact bilaterally. XII - Tongue protrusion intact.  Motor Strength - The patient's strength was normal in all extremities and pronator drift was absent.  Bulk was normal and fasciculations were absent.   Motor Tone - Muscle tone was assessed at the neck and appendages and was normal.  Reflexes - The patient's reflexes were 1+ in all extremities and he had no pathological reflexes.  Sensory - Light touch, temperature/pinprick, vibration and proprioception were assessed and were normal.   Coordination - The patient had normal movements in the hands and feet with no ataxia or dysmetria.  Tremor was absent.  Gait and Station - The patient's transfers, posture, gait, station, and turns were observed as normal.  Data reviewed: I personally reviewed the images and agree with the radiology interpretations.  MRI 02/10/15 - acute right MCA territory  infarct, mainly involve right lentiform nuclei  CTA head 02/10/15 - no large vessel occlusion  Carotid Doppler - negative for bilateral ICA stenosis  TCD emboli detection - negative for microemboli  Sleep study - positive for OSA  Component     Latest Ref Rng 02/15/2015  Cholesterol, Total     100 - 199 mg/dL 960200 (H)  Triglycerides     0 - 149 mg/dL 454182 (H)  HDL Cholesterol     >39 mg/dL 22 (L)  VLDL Cholesterol Cal     5 - 40 mg/dL 36  LDL (calc)     0 - 99 mg/dL 098142 (H)  Total CHOL/HDL Ratio     0.0 - 5.0 ratio units 9.1 (H)   A1c 6.1  Assessment: As you may recall, he is a 55 y.o. African American male with PMH of diabetes, hypertension, hyperlipidemia, smoker, OSA followed up in clinic for right MCA territory infarct occurred in 01/2015. Stroke location consistent with small vessel disease, however size a little bit bigger than normal lacunar infarct. Patient has significant stroke risk factors including HTN, DM,  HLD, OSA, smoker, and obesity. CUS, CTA head, 2D echo and 30 day monitoring unremarkable. LDL 142 and A1C 6.1. On plavix and lipitor for stroke prevention. Follows with Dr. Golden Hurter for OSA and had dental device at night. Has quit smoking. Found to be in depressed mood with HA and insomnia. He was put on nortriptyline low dose. The HA much improved, but still has mild depression and insomnia. And nortriptyline was increased to 50mg  Qhs. However, during interval time, he stopped nortriptyline by himself. So far doing well. Recent episode of CP and SOB and follows with Dr. Jacinto Halim and will do stress test. Has back to work.   Plan:  - continue plavix and lipitor for stroke prevention - Continue to abstain from smoking - continue to use dental device for OSA  - follow up with cardiology for stress test. - Follow up with your primary care physician for stroke risk factor modification. Recommend maintain blood pressure goal around 130/80, diabetes with hemoglobin A1c goal below  6.5% and lipids with LDL cholesterol goal below 70 mg/dL.  - check BP and glucose at home - healthy diabetic diet and regular exercises - follow up in one year.  I spent more than 25 minutes of face to face time with the patient. Greater than 50% of time was spent in counseling and coordination of care. We have discussed about cardiology follow up, BP and glucose control treatment and increase activity at home.   No orders of the defined types were placed in this encounter.    No orders of the defined types were placed in this encounter.    Patient Instructions  - continue plavix and lipitor for stroke prevention - Continue to abstain from smoking - continue to use dental device for OSA  - follow up with cardiology for stress test. - Follow up with your primary care physician for stroke risk factor modification. Recommend maintain blood pressure goal around 130/80, diabetes with hemoglobin A1c goal below 6.5% and lipids with LDL cholesterol goal below 70 mg/dL.  - check BP and glucose at home - healthy diabetic diet and regular exercises - follow up in one year.    Marvel Plan, MD PhD [facility] [address], [address] [place], [place] [address] [phone]

## 2016-03-05 NOTE — Patient Instructions (Signed)
-   continue plavix and lipitor for stroke prevention - Continue to abstain from smoking - continue to use dental device for OSA  - follow up with cardiology for stress test. - Follow up with your primary care physician for stroke risk factor modification. Recommend maintain blood pressure goal around 130/80, diabetes with hemoglobin A1c goal below 6.5% and lipids with LDL cholesterol goal below 70 mg/dL.  - check BP and glucose at home - healthy diabetic diet and regular exercises - follow up in one year.

## 2016-04-02 ENCOUNTER — Other Ambulatory Visit: Payer: Self-pay | Admitting: Neurology

## 2016-04-27 DIAGNOSIS — I209 Angina pectoris, unspecified: Secondary | ICD-10-CM | POA: Diagnosis present

## 2016-04-27 NOTE — H&P (Signed)
OFFICE VISIT NOTES COPIED TO EPIC FOR DOCUMENTATION  . History of Present Illness Laverda Page MD; 04/27/2016 3:10 PM) Patient words: F/U for nuc and labs; Last OV 03/01/2016.  The patient is a 55 year old male who presents for a Follow-up for Chest pain. He has a history of hyperlipidemia, hypertension, diabetes, obstructive sleep apnea, and history of CVA on 02/09/2015 he presented with left-sided facial droop, blurred vision, and left upper and lower extremity weakness. MRI confirmed right basal ganglion stroke. He has mild taste disturbance and mild difficulty with fine motor movements, he has no residual defects from CVA.  He was started on atorvastatin and Plavix. He presented to [facility] on 01/22/2016 with chest tightening that began while doing yard work. Also reports associated shortness of breath with chest pain and no rest pain. He continues to report dyspnea even with routine activities. He denies any orthopnea, PND, edema, dizziness, syncope, or symptoms suggestive of claudication or recurrent TIA/CVA. Also has history of prior tobacco use, quit after CVA on 02/09/2015.   Problem List/Past Medical Anderson Malta Sergeant; 04/27/2016 1:12 PM) Chest pain, atypical (R07.89)  Lexiscan sestamibi stress test 04/20/2016: 1. Patient initially attempted treadmill stress test, could not achieve target heart rate (77% of MPHR), due to dyspnea & stress changed to pharmacologic stress. The resting electrocardiogram demonstrated normal sinus rhythm, normal resting conduction, no resting arrhythmias and normal rest repolarization. Stress EKG is non-diagnostic for ischemia as it a pharmacologic stress using Lexiscan. 2. The perfusion imaging study demonstrates a small sized moderate inferior wall ischemia. The left ventricle was mildly dilated both in rest and stress images, LV end diastolic volume 250 mL. Left ventricular systolic function calculated by QGS was 44% with mild global  hypokinesis and inferior hypokinesis. This is an intermediate risk study, clinical correlation recommended Benign essential hypertension (I10)  Controlled type 2 diabetes mellitus with complication, without long-term current use of insulin (E11.8)  Hyperlipidemia, group A (E78.00)  Carotid atherosclerosis, bilateral (I65.23)  Carotid duplex 02/10/2015: Less than 50% stenosis in the right and left ICA. BPH (benign prostatic hyperplasia) (N40.0)  History of CVA (cerebrovascular accident) (Z86.73) 02/09/2015  Allergies Anderson Malta Sergeant; 04/27/2016 1:12 PM) Contrast Media Ready-Box *MEDICAL DEVICES AND SUPPLIES*  BEE VENOM   Family History Anderson Malta Sergeant; 04/27/2016 1:12 PM) Mother  In stable health. no heart attacks or strokes, no cardiovascular conditions; (89, dementia) Father  Deceased. at age 52 from stroke complications; several CVA episodes throughout life time, first MI at age 13 and had a total of 2 MIs in his lifetime; HTN, DM, but no other cardiovascular conditions Sister 1  Deceased. at age 22 from MI complications; no other events prior, no strokes, no other known cardiovascular conditions; 9 yrs older Sister 2  In good health. 10 yrs older; no cardiovascular conditions Brother 1  In good health. 4 yrs older; no cardiovascular conditions  Social History Anderson Malta Sergeant; 04/27/2016 1:12 PM) Marital status  Married. Living Situation  Lives with spouse. Number of Children  1. Current tobacco use  Former smoker. quit 02/09/2015 Non Drinker/No Alcohol Use   Past Surgical History Anderson Malta Sergeant; 04/27/2016 1:12 PM) [facility] Finger Surgery 1985 Lithotripsy 2002 Rotator Cuff Replacement 2006 Left.  Medication History Anderson Malta Sergeant; 04/27/2016 1:15 PM) Metoprolol Succinate ER (100MG Tablet ER 24HR, 1 Oral daily) Active. Atorvastatin Calcium (10MG Tablet, 1 Oral daily) Active. Amlodipine Besy-Benazepril HCl (10-20MG Capsule, 1 Oral daily)  Active. Clopidogrel Bisulfate (75MG Tablet, 1 Oral daily) Active. Omeprazole (40MG Capsule DR, 1  Oral daily) Active. MetFORMIN HCl (500MG Tablet, 2 tablets in the am Oral 1 tablet in the pm) Active. Alfuzosin HCl (10MG Tablet ER, 1 Oral daily) Active. GlipiZIDE (10MG Tablet, 1 Oral two times daily) Active. Medications Reconciled (verbally)  Diagnostic Studies History Anderson Malta Sergeant; 04/27/2016 1:12 PM) Nuclear stress test 04/20/2016 1. Patient initially attempted treadmill stress test, could not achieve target heart rate (77% of MPHR), due to dyspnea & stress changed to pharmacologic stress. The resting electrocardiogram demonstrated normal sinus rhythm, normal resting conduction, no resting arrhythmias and normal rest repolarization. Stress EKG is non-diagnostic for ischemia as it a pharmacologic stress using Lexiscan. 2. The perfusion imaging study demonstrates a small sized moderate inferior wall ischemia. The left ventricle was mildly dilated both in rest and stress images, LV end diastolic volume 542 mL. Left ventricular systolic function calculated by QGS was 44% with mild global hypokinesis and inferior hypokinesis. This is an intermediate risk study, clinical correlation recommended. Labwork  Lipid profile 04/20/2016: Total Cholesterol 120, triglycerides 96, HDL 25, LDL 76. Lipid profile 02/15/2015: Total cholesterol 200, triglycerides 182, HDL 22, LDL 142 02/06/2016: HbA1c 7.0%. 01/22/2016: Creatinine 1.16, potassium 3.0, CMP otherwise normal, CBC normal, delta troponin negative Treadmill stress test 2000 Colonoscopy 2008 Carotid Dopplers 02/10/2015 Less than 50% stenosis in the right and left ICA. Echocardiogram 04/18/2015 Mild LVH, LVEF 50-55%. Grade 1 diastolic dysfunction. Otherwise normal. Renal Dopplers 10/26/2015    Review of Systems Laverda Page MD; 04/27/2016 3:10 PM) General Not Present- Anorexia, Fatigue and Fever. Respiratory Present- Difficulty Breathing on  Exertion. Not Present- Cough, Decreased Exercise Tolerance and Dyspnea. Cardiovascular Present- Chest Pain. Not Present- Claudications, Edema, Orthopnea, Palpitations and Paroxysmal Nocturnal Dyspnea. Gastrointestinal Not Present- Black, Tarry Stool, Change in Bowel Habits and Nausea. Neurological Not Present- Focal Neurological Symptoms and Syncope. Endocrine Not Present- Cold Intolerance, Excessive Sweating, Heat Intolerance and Thyroid Problems. Hematology Not Present- Anemia, Easy Bruising, Petechiae and Prolonged Bleeding.  Vitals Anderson Malta Sergeant; 04/27/2016 1:17 PM) 04/27/2016 1:12 PM Weight: 223.5 lb Height: 72in Body Surface Area: 2.23 m Body Mass Index: 30.31 kg/m  Pulse: 69 (Regular)  P.OX: 98% (Room air) BP: 144/80 (Sitting, Left Arm, Standard)       Physical Exam Laverda Page, MD; 04/27/2016 3:13 PM) General Mental Status-Alert. General Appearance-Cooperative and Appears stated age. Build & Nutrition-Well built and Mildly obese.  Head and Neck Thyroid Gland Characteristics - normal size and consistency and no palpable nodules.  Chest and Lung Exam Chest and lung exam reveals -quiet, even and easy respiratory effort with no use of accessory muscles, non-tender and on auscultation, normal breath sounds, no adventitious sounds.  Cardiovascular Cardiovascular examination reveals -normal heart sounds, regular rate and rhythm with no murmurs, carotid auscultation reveals no bruits, abdominal aorta auscultation reveals no bruits and no prominent pulsation, femoral artery auscultation bilaterally reveals normal pulses, no bruits, no thrills, normal pedal pulses bilaterally and no digital clubbing, cyanosis, edema, increased warmth or tenderness.  Abdomen Palpation/Percussion Normal exam - Non Tender and No hepatosplenomegaly.  Neurologic Neurologic evaluation reveals -alert and oriented x 3 with no impairment of recent or remote  memory. Motor-Grossly intact without any focal deficits.  Musculoskeletal Global Assessment Left Lower Extremity - no deformities, masses or tenderness, no known fractures. Right Lower Extremity - no deformities, masses or tenderness, no known fractures.  Myocardial perfusion imaging: Comments: Lexiscan sestamibi stress test 04/20/2016: 1. Patient initially attempted treadmill stress test, could not achieve target heart rate (77% of MPHR), due to dyspnea & stress  changed to pharmacologic stress. The resting electrocardiogram demonstrated normal sinus rhythm, normal resting conduction, no resting arrhythmias and normal rest repolarization. Stress EKG is non-diagnostic for ischemia as it a pharmacologic stress using Lexiscan. 2. The perfusion imaging study demonstrates a small sized moderate inferior wall ischemia. The left ventricle was mildly dilated both in rest and stress images, LV end diastolic volume 409 mL. Left ventricular systolic function calculated by QGS was 44% with mild global hypokinesis and inferior hypokinesis. This is an intermediate risk study, clinical correlation recommended.  Performed: 04/22/2016 8:22 AM  Assessment & Plan Laverda Page MD; 04/27/2016 5:17 PM) Chest pain, atypical (R07.89) Story: Lexiscan sestamibi stress test 04/20/2016: 1. Patient initially attempted treadmill stress test, could not achieve target heart rate (77% of MPHR), due to dyspnea & stress changed to pharmacologic stress. The resting electrocardiogram demonstrated normal sinus rhythm, normal resting conduction, no resting arrhythmias and normal rest repolarization. Stress EKG is non-diagnostic for ischemia as it a pharmacologic stress using Lexiscan. 2. The perfusion imaging study demonstrates a small sized moderate inferior wall ischemia. The left ventricle was mildly dilated both in rest and stress images, LV end diastolic volume 811 mL. Left ventricular systolic function calculated by QGS  was 44% with mild global hypokinesis and inferior hypokinesis. This is an intermediate risk study, clinical correlation recommended Impression: EKG 03/01/2016: Normal sinus rhythm at rate of 75 bpm, normal axis. No evidence of ischemia, normal EKG. Current Plans CBC & PLATELETS (AUTO) (91478) METABOLIC PANEL, BASIC (29562) PT (PROTHROMBIN TIME) (13086) Future Plans 05/23/2016: Echocardiography, transthoracic, real-time with image documentation (2D), includes M-mode recording, when performed, complete, with spectral Doppler echocardiography, and with color flow Doppler echocardiography (57846) - one time Benign essential hypertension (I10) Current Plans Started Potassium Chloride ER 20MEQ, 1 (one) Tablet daily with food, #30, 04/27/2016, No Refill. Controlled type 2 diabetes mellitus with complication, without long-term current use of insulin (E11.8) Hyperlipidemia, group A (E78.00) History of CVA (cerebrovascular accident) (N62.95) Carotid atherosclerosis, bilateral (M84.13) Story: Carotid duplex 02/10/2015: Less than 50% stenosis in the right and left ICA.  Labs 04/27/2016: HB 12.0/HCT 34.0.  Platelets 309.  Serum glucose 98 mg, BUN 13, serum creatinine 1.17, eGFR 81 mL.  The test and 3.4.  Pro time 10.1 seconds.  Labs are within normal limits to proceed with coronary angiography. Replace K as previously also low.  Current Plans Mechanism of underlying disease process and action of medications discussed with the patient. Patient's symptoms are suggestive of angina pectoris, patient has clearly reduced his physical activity due to fear of reproducing chest discomfort. He is on maximal medical therapy and still is symptomatic. Schedule for cardiac catheterization, and possible angioplasty. We discussed regarding risks, benefits, alternatives to this including stress testing, CTA and continued medical therapy. Patient wants to proceed. Understands <1-2% risk of death, stroke, MI, urgent CABG, bleeding,  infection, renal failure but not limited to these. Video recording of the procedure shown to the patient. Schedule trans-thoracic echocardiogram. Carotid duplex for carotid atheresclerosis. No changes to his medications. Lipids show reduced HDL, but LDL at goal. Office visit after the tests.  CC Dr. Jerene Bears.  Future Plans 05/23/2016: Complete duplex scan of both carotid arteries (24401) - one time   Signed by Laverda Page, MD (04/27/2016 5:17 PM)

## 2016-05-01 ENCOUNTER — Encounter (HOSPITAL_COMMUNITY): Payer: Self-pay | Admitting: *Deleted

## 2016-05-01 ENCOUNTER — Encounter (HOSPITAL_COMMUNITY)
Admission: RE | Disposition: A | Discharge status: Home / Self Care | Payer: Self-pay | Source: Ambulatory Visit | Attending: Cardiology

## 2016-05-01 ENCOUNTER — Ambulatory Visit (HOSPITAL_COMMUNITY)
Admission: RE | Admit: 2016-05-01 | Discharge: 2016-05-01 | Disposition: A | Discharge status: Home / Self Care | Payer: BC Managed Care – PPO | Source: Ambulatory Visit | Attending: Cardiology | Admitting: Cardiology

## 2016-05-01 DIAGNOSIS — Z8249 Family history of ischemic heart disease and other diseases of the circulatory system: Secondary | ICD-10-CM | POA: Insufficient documentation

## 2016-05-01 DIAGNOSIS — E118 Type 2 diabetes mellitus with unspecified complications: Secondary | ICD-10-CM | POA: Diagnosis not present

## 2016-05-01 DIAGNOSIS — Z833 Family history of diabetes mellitus: Secondary | ICD-10-CM | POA: Diagnosis not present

## 2016-05-01 DIAGNOSIS — E785 Hyperlipidemia, unspecified: Secondary | ICD-10-CM | POA: Diagnosis not present

## 2016-05-01 DIAGNOSIS — N400 Enlarged prostate without lower urinary tract symptoms: Secondary | ICD-10-CM | POA: Diagnosis not present

## 2016-05-01 DIAGNOSIS — Z7902 Long term (current) use of antithrombotics/antiplatelets: Secondary | ICD-10-CM | POA: Diagnosis not present

## 2016-05-01 DIAGNOSIS — Z823 Family history of stroke: Secondary | ICD-10-CM | POA: Diagnosis not present

## 2016-05-01 DIAGNOSIS — Z7984 Long term (current) use of oral hypoglycemic drugs: Secondary | ICD-10-CM | POA: Diagnosis not present

## 2016-05-01 DIAGNOSIS — R072 Precordial pain: Secondary | ICD-10-CM | POA: Diagnosis present

## 2016-05-01 DIAGNOSIS — I6523 Occlusion and stenosis of bilateral carotid arteries: Secondary | ICD-10-CM | POA: Insufficient documentation

## 2016-05-01 DIAGNOSIS — I10 Essential (primary) hypertension: Secondary | ICD-10-CM | POA: Insufficient documentation

## 2016-05-01 DIAGNOSIS — Z8673 Personal history of transient ischemic attack (TIA), and cerebral infarction without residual deficits: Secondary | ICD-10-CM | POA: Diagnosis not present

## 2016-05-01 DIAGNOSIS — R9439 Abnormal result of other cardiovascular function study: Secondary | ICD-10-CM | POA: Diagnosis not present

## 2016-05-01 DIAGNOSIS — G4733 Obstructive sleep apnea (adult) (pediatric): Secondary | ICD-10-CM | POA: Insufficient documentation

## 2016-05-01 DIAGNOSIS — Z87891 Personal history of nicotine dependence: Secondary | ICD-10-CM | POA: Diagnosis not present

## 2016-05-01 DIAGNOSIS — I209 Angina pectoris, unspecified: Secondary | ICD-10-CM | POA: Diagnosis present

## 2016-05-01 HISTORY — PX: CARDIAC CATHETERIZATION: SHX172

## 2016-05-01 LAB — GLUCOSE, CAPILLARY: Glucose-Capillary: 142 mg/dL — ABNORMAL HIGH (ref 65–99)

## 2016-05-01 LAB — POTASSIUM: POTASSIUM: 3.1 mmol/L — AB (ref 3.5–5.1)

## 2016-05-01 SURGERY — LEFT HEART CATH AND CORONARY ANGIOGRAPHY
Anesthesia: LOCAL

## 2016-05-01 MED ORDER — METHYLPREDNISOLONE SODIUM SUCC 125 MG IJ SOLR
INTRAMUSCULAR | Status: AC
  Filled 2016-05-01: qty 2

## 2016-05-01 MED ORDER — ASPIRIN 81 MG PO CHEW
CHEWABLE_TABLET | ORAL | Status: AC
  Filled 2016-05-01: qty 1

## 2016-05-01 MED ORDER — LIDOCAINE HCL (PF) 1 % IJ SOLN
INTRAMUSCULAR | Status: DC | PRN
  Administered 2016-05-01: 2 mL

## 2016-05-01 MED ORDER — VERAPAMIL HCL 2.5 MG/ML IV SOLN
INTRA_ARTERIAL | Status: DC | PRN
  Administered 2016-05-01: 10 mL via INTRA_ARTERIAL

## 2016-05-01 MED ORDER — HEPARIN (PORCINE) IN NACL 2-0.9 UNIT/ML-% IJ SOLN
INTRAMUSCULAR | Status: DC | PRN
  Administered 2016-05-01: 1000 mL

## 2016-05-01 MED ORDER — HYDROMORPHONE HCL 1 MG/ML IJ SOLN
INTRAMUSCULAR | Status: DC | PRN
  Administered 2016-05-01: 0.5 mg via INTRAVENOUS

## 2016-05-01 MED ORDER — METHYLPREDNISOLONE SODIUM SUCC 125 MG IJ SOLR
125.0000 mg | Freq: Once | INTRAMUSCULAR | Status: AC
  Administered 2016-05-01: 125 mg via INTRAVENOUS

## 2016-05-01 MED ORDER — SODIUM CHLORIDE 0.9% FLUSH: 3.0000 mL | Freq: Two times a day (BID) | INTRAVENOUS | Status: DC

## 2016-05-01 MED ORDER — SODIUM CHLORIDE 0.9 % IV SOLN: 250.0000 mL | INTRAVENOUS | Status: DC | PRN

## 2016-05-01 MED ORDER — HEPARIN SODIUM (PORCINE) 1000 UNIT/ML IJ SOLN
INTRAMUSCULAR | Status: AC
  Filled 2016-05-01: qty 1

## 2016-05-01 MED ORDER — SODIUM CHLORIDE 0.9% FLUSH: 3.0000 mL | INTRAVENOUS | Status: DC | PRN

## 2016-05-01 MED ORDER — MIDAZOLAM HCL 2 MG/2ML IJ SOLN
INTRAMUSCULAR | Status: AC
  Filled 2016-05-01: qty 2

## 2016-05-01 MED ORDER — METFORMIN HCL 500 MG PO TABS: 500.0000 mg | ORAL_TABLET | ORAL | Status: DC

## 2016-05-01 MED ORDER — HYDROMORPHONE HCL 1 MG/ML IJ SOLN
INTRAMUSCULAR | Status: AC
Start: 2016-05-01 — End: 2016-05-01
  Filled 2016-05-01: qty 1

## 2016-05-01 MED ORDER — SODIUM CHLORIDE 0.9 % WEIGHT BASED INFUSION: 3.0000 mL/kg/h | INTRAVENOUS | Status: DC

## 2016-05-01 MED ORDER — IOPAMIDOL (ISOVUE-370) INJECTION 76%
INTRAVENOUS | Status: AC
  Filled 2016-05-01: qty 100

## 2016-05-01 MED ORDER — CLOPIDOGREL BISULFATE 75 MG PO TABS
75.0000 mg | ORAL_TABLET | Freq: Once | ORAL | Status: AC
  Administered 2016-05-01: 75 mg via ORAL

## 2016-05-01 MED ORDER — NITROGLYCERIN 1 MG/10 ML FOR IR/CATH LAB
INTRA_ARTERIAL | Status: AC
  Filled 2016-05-01: qty 10

## 2016-05-01 MED ORDER — IOPAMIDOL (ISOVUE-370) INJECTION 76%
INTRAVENOUS | Status: DC | PRN
  Administered 2016-05-01: 75 mL via INTRA_ARTERIAL

## 2016-05-01 MED ORDER — DIPHENHYDRAMINE HCL 50 MG/ML IJ SOLN
INTRAMUSCULAR | Status: AC
  Filled 2016-05-01: qty 1

## 2016-05-01 MED ORDER — DIPHENHYDRAMINE HCL 50 MG/ML IJ SOLN
25.0000 mg | Freq: Once | INTRAMUSCULAR | Status: AC
  Administered 2016-05-01: 25 mg via INTRAVENOUS

## 2016-05-01 MED ORDER — ASPIRIN 81 MG PO CHEW
81.0000 mg | CHEWABLE_TABLET | ORAL | Status: AC
  Administered 2016-05-01: 81 mg via ORAL

## 2016-05-01 MED ORDER — SODIUM CHLORIDE 0.9 % WEIGHT BASED INFUSION
3.0000 mL/kg/h | INTRAVENOUS | Status: DC
  Administered 2016-05-01: 3 mL/kg/h via INTRAVENOUS

## 2016-05-01 MED ORDER — VERAPAMIL HCL 2.5 MG/ML IV SOLN
INTRAVENOUS | Status: AC
  Filled 2016-05-01: qty 2

## 2016-05-01 MED ORDER — LIDOCAINE HCL (PF) 1 % IJ SOLN
INTRAMUSCULAR | Status: AC
  Filled 2016-05-01: qty 30

## 2016-05-01 MED ORDER — FAMOTIDINE IN NACL 20-0.9 MG/50ML-% IV SOLN
20.0000 mg | Freq: Once | INTRAVENOUS | Status: AC
  Administered 2016-05-01: 20 mg via INTRAVENOUS

## 2016-05-01 MED ORDER — FAMOTIDINE IN NACL 20-0.9 MG/50ML-% IV SOLN
INTRAVENOUS | Status: AC
  Filled 2016-05-01: qty 50

## 2016-05-01 MED ORDER — MIDAZOLAM HCL 2 MG/2ML IJ SOLN
INTRAMUSCULAR | Status: DC | PRN
  Administered 2016-05-01: 2 mg via INTRAVENOUS

## 2016-05-01 MED ORDER — HEPARIN (PORCINE) IN NACL 2-0.9 UNIT/ML-% IJ SOLN
INTRAMUSCULAR | Status: AC
  Filled 2016-05-01: qty 1000

## 2016-05-01 MED ORDER — CLOPIDOGREL BISULFATE 75 MG PO TABS
ORAL_TABLET | ORAL | Status: AC
  Filled 2016-05-01: qty 1

## 2016-05-01 MED ORDER — HEPARIN SODIUM (PORCINE) 1000 UNIT/ML IJ SOLN
INTRAMUSCULAR | Status: DC | PRN
  Administered 2016-05-01: 6000 [IU] via INTRAVENOUS

## 2016-05-01 MED ORDER — SODIUM CHLORIDE 0.9 % WEIGHT BASED INFUSION
1.0000 mL/kg/h | INTRAVENOUS | Status: DC
  Administered 2016-05-01: 500 mL via INTRAVENOUS

## 2016-05-01 SURGICAL SUPPLY — 9 items
CATH INFINITI 5 FR JL3.5 (CATHETERS) ×1 IMPLANT
CATH OPTITORQUE TIG 4.0 5F (CATHETERS) ×1 IMPLANT
DEVICE RAD COMP TR BAND LRG (VASCULAR PRODUCTS) ×1 IMPLANT
GLIDESHEATH SLEND A-KIT 6F 20G (SHEATH) ×1 IMPLANT
KIT HEART LEFT (KITS) ×2 IMPLANT
PACK CARDIAC CATHETERIZATION (CUSTOM PROCEDURE TRAY) ×2 IMPLANT
TRANSDUCER W/STOPCOCK (MISCELLANEOUS) ×2 IMPLANT
TUBING CIL FLEX 10 FLL-RA (TUBING) ×2 IMPLANT
WIRE SAFE-T 1.5MM-J .035X260CM (WIRE) ×1 IMPLANT

## 2016-05-01 NOTE — Interval H&P Note (Signed)
History and Physical Interval Note:  05/01/2016 9:55 AM  Laurann MontanaJerry A Schouten  has presented today for surgery, with the diagnosis of cp  The various methods of treatment have been discussed with the patient and family. After consideration of risks, benefits and other options for treatment, the patient has consented to  Procedure(s): Left Heart Cath and Coronary Angiography (N/A) and possible PCI as a surgical intervention .  The patient's history has been reviewed, patient examined, no change in status, stable for surgery.  I have reviewed the patient's chart and labs.  Questions were answered to the patient's satisfaction.   Ischemic Symptoms? CCS III (Marked limitation of ordinary activity) Anti-ischemic Medical Therapy? Maximal Medical Therapy (2 or more classes of medications) Non-invasive Test Results? Intermediate-risk stress test findings: cardiac mortality 1-3%/year Prior CABG? No Previous CABG   Patient Information:   1-2V CAD, no prox LAD  A (8)  Indication: 17; Score: 8   Patient Information:   CTO of 1 vessel, no other CAD  A (7)  Indication: 27; Score: 7   Patient Information:   1V CAD with prox LAD  A (9)  Indication: 33; Score: 9   Patient Information:   2V-CAD with prox LAD  A (9)  Indication: 39; Score: 9   Patient Information:   3V-CAD without LMCA  A (9)  Indication: 45; Score: 9   Patient Information:   3V-CAD without LMCA With Abnormal LV systolic function  A (9)  Indication: 48; Score: 9   Patient Information:   LMCA-CAD  A (9)  Indication: 49; Score: 9   Patient Information:   2V-CAD with prox LAD PCI  A (7)  Indication: 62; Score: 7   Patient Information:   2V-CAD with prox LAD CABG  A (8)  Indication: 62; Score: 8   Patient Information:   3V-CAD without LMCA With Low CAD burden(i.e., 3 focal stenoses, low SYNTAX score) PCI  A (7)  Indication: 63; Score: 7   Patient Information:   3V-CAD without LMCA With Low  CAD burden(i.e., 3 focal stenoses, low SYNTAX score) CABG  A (9)  Indication: 63; Score: 9   Patient Information:   3V-CAD without LMCA E06c - Intermediate-high CAD burden (i.e., multiple diffuse lesions, presence of CTO, or high SYNTAX score) PCI  U (4)  Indication: 64; Score: 4   Patient Information:   3V-CAD without LMCA E06c - Intermediate-high CAD burden (i.e., multiple diffuse lesions, presence of CTO, or high SYNTAX score) CABG  A (9)  Indication: 64; Score: 9   Patient Information:   LMCA-CAD With Isolated LMCA stenosis  PCI  U (6)  Indication: 65; Score: 6   Patient Information:   LMCA-CAD With Isolated LMCA stenosis  CABG  A (9)  Indication: 65; Score: 9   Patient Information:   LMCA-CAD Additional CAD, low CAD burden (i.e., 1- to 2-vessel additional involvement, low SYNTAX score) PCI  U (5)  Indication: 66; Score: 5   Patient Information:   LMCA-CAD Additional CAD, low CAD burden (i.e., 1- to 2-vessel additional involvement, low SYNTAX score) CABG  A (9)  Indication: 66; Score: 9   Patient Information:   LMCA-CAD Additional CAD, intermediate-high CAD burden (i.e., 3-vessel involvement, presence of CTO, or high SYNTAX score) PCI  I (3)  Indication: 67; Score: 3   Patient Information:   LMCA-CAD Additional CAD, intermediate-high CAD burden (i.e., 3-vessel involvement, presence of CTO, or high SYNTAX score) CABG  A (9)  Indication: 67; Score: 9  Eric Glass

## 2016-05-01 NOTE — Discharge Instructions (Signed)
Radial Site Care °Refer to this sheet in the next few weeks. These instructions provide you with information about caring for yourself after your procedure. Your health care provider may also give you more specific instructions. Your treatment has been planned according to current medical practices, but problems sometimes occur. Call your health care provider if you have any problems or questions after your procedure. °WHAT TO EXPECT AFTER THE PROCEDURE °After your procedure, it is typical to have the following: °· Bruising at the radial site that usually fades within 1-2 weeks. °· Blood collecting in the tissue (hematoma) that may be painful to the touch. It should usually decrease in size and tenderness within 1-2 weeks. °HOME CARE INSTRUCTIONS °· Take medicines only as directed by your health care provider. °· You may shower 24-48 hours after the procedure or as directed by your health care provider. Remove the bandage (dressing) and gently wash the site with plain soap and water. Pat the area dry with a clean towel. Do not rub the site, because this may cause bleeding. °· Do not take baths, swim, or use a hot tub until your health care provider approves. °· Check your insertion site every day for redness, swelling, or drainage. °· Do not apply powder or lotion to the site. °· Do not flex or bend the affected arm for 24 hours or as directed by your health care provider. °· Do not push or pull heavy objects with the affected arm for 24 hours or as directed by your health care provider. °· Do not lift over 10 lb (4.5 kg) for 5 days after your procedure or as directed by your health care provider. °· Ask your health care provider when it is okay to: °¨ Return to work or school. °¨ Resume usual physical activities or sports. °¨ Resume sexual activity. °· Do not drive home if you are discharged the same day as the procedure. Have someone else drive you. °· You may drive 24 hours after the procedure unless otherwise  instructed by your health care provider. °· Do not operate machinery or power tools for 24 hours after the procedure. °· If your procedure was done as an outpatient procedure, which means that you went home the same day as your procedure, a responsible adult should be with you for the first 24 hours after you arrive home. °· Keep all follow-up visits as directed by your health care provider. This is important. °SEEK MEDICAL CARE IF: °· You have a fever. °· You have chills. °· You have increased bleeding from the radial site. Hold pressure on the site. °SEEK IMMEDIATE MEDICAL CARE IF: °· You have unusual pain at the radial site. °· You have redness, warmth, or swelling at the radial site. °· You have drainage (other than a small amount of blood on the dressing) from the radial site. °· The radial site is bleeding, and the bleeding does not stop after 30 minutes of holding steady pressure on the site. °· Your arm or hand becomes pale, cool, tingly, or numb. °  °This information is not intended to replace advice given to you by your health care provider. Make sure you discuss any questions you have with your health care provider. °  °Document Released: 09/15/2010 Document Revised: 09/03/2014 Document Reviewed: 03/01/2014 °Elsevier Interactive Patient Education ©2016 [employer] ° °

## 2016-09-25 ENCOUNTER — Encounter: Payer: Self-pay | Admitting: "Endocrinology

## 2016-09-25 ENCOUNTER — Ambulatory Visit (INDEPENDENT_AMBULATORY_CARE_PROVIDER_SITE_OTHER): Payer: BC Managed Care – PPO | Admitting: "Endocrinology

## 2016-09-25 VITALS — BP 143/89 | HR 78 | Ht 72.0 in | Wt 227.0 lb

## 2016-09-25 DIAGNOSIS — E0590 Thyrotoxicosis, unspecified without thyrotoxic crisis or storm: Secondary | ICD-10-CM

## 2016-09-25 NOTE — Progress Notes (Signed)
Subjective:    Patient ID: Eric MontanaJerry A Blasdel, male    DOB: 12/30/1960, PCP Ignatius Speckinghruv B Vyas, MD.   Past Medical History:  Diagnosis Date  . Arthritis   . Chest pain   . CVA (cerebral infarction)   . Diabetes mellitus without complication (HCC)   . Headache   . Hypertension   . Neck pain   . Stroke [facility](HCC)    Past Surgical History:  Procedure Laterality Date  . CARDIAC CATHETERIZATION N/A 05/01/2016   Procedure: Left Heart Cath and Coronary Angiography;  Surgeon: Yates DecampJay Ganji, MD;  [facility];  Service: Cardiovascular;  Laterality: N/A;  . rotator cuff replacement Left    Social History   Social History  . Marital status: Married    Spouse name: N/A  . Number of children: N/A  . Years of education: N/A   Social History Main Topics  . Smoking status: Former Smoker    Quit date: 02/09/2015  . Smokeless tobacco: Never Used  . Alcohol use No  . Drug use: No  . Sexual activity: Not Asked   Other Topics Concern  . None   Social History Narrative   Drinks 1 cup of caffeine daily.   Outpatient Encounter Prescriptions as of 09/25/2016  Medication Sig  . alfuzosin (UROXATRAL) 10 MG 24 hr tablet Take 10 mg by mouth daily with breakfast.  . amLODipine-benazepril (LOTREL) 10-20 MG per capsule Take 1 capsule by mouth daily.  Marland Kitchen. atorvastatin (LIPITOR) 10 MG tablet Take 10 mg by mouth every evening.   . clopidogrel (PLAVIX) 75 MG tablet TAKE ONE TABLET BY MOUTH ONCE DAILY  . glipiZIDE (GLUCOTROL) 5 MG tablet Take 5 mg by mouth 2 (two) times daily before a meal.  . metFORMIN (GLUCOPHAGE) 500 MG tablet Take 1-2 tablets (500-1,000 mg total) by mouth See admin instructions. 2 tablets (1000 mg) in the morning, and 1 tablet (500 mg) every evening  . metoprolol succinate (TOPROL-XL) 100 MG 24 hr tablet Take 1 tablet by mouth daily.   Marland Kitchen. omeprazole (PRILOSEC) 40 MG capsule Take 40 mg by mouth every morning.   Marland Kitchen. aspirin 81 MG chewable tablet Chew 81 mg by mouth daily.  .  methocarbamol (ROBAXIN) 500 MG tablet Take 500 mg by mouth every 8 (eight) hours as needed for muscle spasms.  Marland Kitchen. pyridoxine (B-6) 100 MG tablet Take 100 mg by mouth daily.  . vitamin B-12 (CYANOCOBALAMIN) 1000 MCG tablet Take 1,000 mcg by mouth daily.   No facility-administered encounter medications on file as of 09/25/2016.    ALLERGIES: Allergies  Allergen Reactions  . Bee Venom Anaphylaxis    EPIPEN required  . Contrast Media [Iodinated Diagnostic Agents] Nausea Only   VACCINATION STATUS:  There is no immunization history on file for this patient.  HPI  The patient presents today with a medical history as above, and is being seen in consultation for hyperthyroidism requested by Dr. Sherril CroonVyas.  The patient has been dealing with symptoms of  Weight loss of 10 pounds, palpitations, and heat intolerance, fatigue, sleep deprivation for several weeks. These symptoms are progressively worsening and troubling to patient.   The patient denies family history of thyroid dysfunction . he patient denies personal history of goiter. Patient is not on any anti-thyroid medications nor on any thyroid hormone supplements. - On 2 occasions she was found to have suppressed TSH. On 06/21/2016 TSH was 0.175, on December 29's 2017 was suppressed to below 0.006. - Patient  is willing  to proceed with appropriate work up and therapy for thyrotoxicosis.   Review of Systems Constitutional: + weight loss, + fatigue, + subjective hyperthermia Eyes: no blurry vision, no xerophthalmia ENT: no sore throat, no nodules palpated in throat, no dysphagia/odynophagia, no hoarseness Cardiovascular: no CP/SOB, +palpitations, leg swelling Respiratory: no cough/SOB Gastrointestinal: no N/V/D/C Musculoskeletal: no muscle/joint aches Skin: no rashes Neurological: +tremors,  - numbness/tingling/dizziness Psychiatric: no depression, +anxiety  Objective:    BP (!) 143/89   Pulse 78   Ht 6' (1.829 m)   Wt 227 lb (103 kg)    BMI 30.79 kg/m   Wt Readings from Last 3 Encounters:  09/25/16 227 lb (103 kg)  05/01/16 223 lb (101.2 kg)  03/05/16 223 lb 3.2 oz (101.2 kg)    Physical Exam Constitutional: overweight, in NAD Eyes: PERRLA, EOMI, no exophthalmos ENT: moist mucous membranes, no thyromegaly, no cervical lymphadenopathy Cardiovascular: RRR, No MRG Respiratory: CTA B Gastrointestinal: abdomen soft, NT, ND, BS+ Musculoskeletal: no deformities, strength intact in all 4 Skin: moist, warm, no rashes Neurological:  Mild tremor with outstretched hands, DTR normal in all 4  Results for orders placed or performed during the hospital encounter of 05/01/16  Glucose, capillary  Result Value Ref Range   Glucose-Capillary 142 (H) 65 - 99 mg/dL  Potassium  Result Value Ref Range   Potassium 3.1 (L) 3.5 - 5.1 mmol/L   Complete Blood Count (Most recent): No results found for: WBC, HGB, HCT, MCV, PLT Chemistry (most recent): Lab Results  Component Value Date   K 3.1 (L) 05/01/2016   06/21/2016 TSH 0.175, 08/24/2016  TSH < 0.006   Assessment & Plan:   1. Hyperthyroidism  Patient's history and most recent labs are reviewed. Findings are Suggestive of  Thyrotoxicosis. -The potential risks of untreated thyrotoxicosis and the need for definitive therapy have been discussed in detail with the patient, and the patient agrees to proceed with plan.   I like to repeat full profile thyroid function tests today and confirmatory thyroid uptake and scan will be scheduled to be done as soon as possible.   Therapy may involve RAI ablation of the thyroid, with subsequent need for lifelong thyroid hormone replacement. Pt is made aware of this fact and willing to proceed. The patient will return in 10 days for treatment decision.  He is already on beta blocker involving metoprolol 100 mg extended release once a day. This will help with symptom control from thyrotoxicosis. - 45 minutes of time was spent on the care of this  patient , 50% of which was applied for counseling on complications of thyrotoxicosis, options ,  and the need for definitive therapy to prevent these complications.  - I advised patient to maintain close follow up with Ignatius Specking, MD for primary care needs.  Follow up plan: Return in about 10 days (around 10/05/2016) for thyroid uptake and scan, labs today.  Marquis Lunch, MD Phone: [phone]  Fax: [phone]   09/25/2016, 2:31 PM

## 2016-09-26 LAB — T4, FREE: Free T4: 1.5 ng/dL (ref 0.8–1.8)

## 2016-09-26 LAB — THYROGLOBULIN ANTIBODY: Thyroglobulin Ab: <1 IU/mL (ref <2)

## 2016-09-26 LAB — TSH: TSH: 0.01 m[IU]/L — AB (ref 0.40–4.50)

## 2016-09-26 LAB — THYROID PEROXIDASE ANTIBODY: Thyroperoxidase Ab SerPl-aCnc: <1 IU/mL (ref <9)

## 2016-09-27 ENCOUNTER — Telehealth: Payer: Self-pay

## 2016-09-27 NOTE — Telephone Encounter (Signed)
Pt notified of appt for thyroid uptake/scan and also to to be NPO 4 hrs prior to 10-03-16 [facility].

## 2016-10-03 ENCOUNTER — Encounter (HOSPITAL_COMMUNITY): Payer: Self-pay

## 2016-10-03 ENCOUNTER — Encounter (HOSPITAL_COMMUNITY)
Admission: RE | Admit: 2016-10-03 | Discharge: 2016-10-03 | Disposition: A | Discharge status: Home / Self Care | Payer: BC Managed Care – PPO | Source: Ambulatory Visit | Attending: "Endocrinology | Admitting: "Endocrinology

## 2016-10-03 DIAGNOSIS — E0590 Thyrotoxicosis, unspecified without thyrotoxic crisis or storm: Secondary | ICD-10-CM

## 2016-10-03 MED ORDER — SODIUM IODIDE I-123 7.4 MBQ CAPS
400.0000 | ORAL_CAPSULE | Freq: Once | ORAL | Status: AC
  Administered 2016-10-03: 357 via ORAL

## 2016-10-04 ENCOUNTER — Encounter (HOSPITAL_COMMUNITY)
Admission: RE | Admit: 2016-10-04 | Discharge: 2016-10-04 | Disposition: A | Discharge status: Home / Self Care | Payer: BC Managed Care – PPO | Source: Ambulatory Visit | Attending: "Endocrinology | Admitting: "Endocrinology

## 2016-10-04 DIAGNOSIS — E0590 Thyrotoxicosis, unspecified without thyrotoxic crisis or storm: Secondary | ICD-10-CM | POA: Diagnosis not present

## 2016-10-08 ENCOUNTER — Encounter: Payer: Self-pay | Admitting: "Endocrinology

## 2016-10-08 ENCOUNTER — Ambulatory Visit (INDEPENDENT_AMBULATORY_CARE_PROVIDER_SITE_OTHER): Payer: BC Managed Care – PPO | Admitting: "Endocrinology

## 2016-10-08 VITALS — BP 136/78 | HR 77 | Ht 72.0 in | Wt 233.0 lb

## 2016-10-08 DIAGNOSIS — E0590 Thyrotoxicosis, unspecified without thyrotoxic crisis or storm: Secondary | ICD-10-CM

## 2016-10-08 NOTE — Progress Notes (Signed)
Subjective:    Patient ID: Eric Glass, male    DOB: 03/05/1961, PCP Ignatius Speckinghruv B Vyas, MD.   Past Medical History:  Diagnosis Date  . Arthritis   . Chest pain   . CVA (cerebral infarction)   . Diabetes mellitus without complication (HCC)   . Headache   . Hypertension   . Neck pain   . Stroke [facility](HCC)    Past Surgical History:  Procedure Laterality Date  . CARDIAC CATHETERIZATION N/A 05/01/2016   Procedure: Left Heart Cath and Coronary Angiography;  Surgeon: Yates DecampJay Ganji, MD;  [facility];  Service: Cardiovascular;  Laterality: N/A;  . rotator cuff replacement Left    Social History   Social History  . Marital status: Married    Spouse name: N/A  . Number of children: N/A  . Years of education: N/A   Social History Main Topics  . Smoking status: Former Smoker    Quit date: 02/09/2015  . Smokeless tobacco: Never Used  . Alcohol use No  . Drug use: No  . Sexual activity: Not Asked   Other Topics Concern  . None   Social History Narrative   Drinks 1 cup of caffeine daily.   Outpatient Encounter Prescriptions as of 10/08/2016  Medication Sig  . alfuzosin (UROXATRAL) 10 MG 24 hr tablet Take 10 mg by mouth daily with breakfast.  . amLODipine-benazepril (LOTREL) 10-20 MG per capsule Take 1 capsule by mouth daily.  Marland Kitchen. aspirin 81 MG chewable tablet Chew 81 mg by mouth daily.  Marland Kitchen. atorvastatin (LIPITOR) 10 MG tablet Take 10 mg by mouth every evening.   . clopidogrel (PLAVIX) 75 MG tablet TAKE ONE TABLET BY MOUTH ONCE DAILY  . glipiZIDE (GLUCOTROL) 5 MG tablet Take 5 mg by mouth 2 (two) times daily before a meal.  . metFORMIN (GLUCOPHAGE) 500 MG tablet Take 1-2 tablets (500-1,000 mg total) by mouth See admin instructions. 2 tablets (1000 mg) in the morning, and 1 tablet (500 mg) every evening  . methocarbamol (ROBAXIN) 500 MG tablet Take 500 mg by mouth every 8 (eight) hours as needed for muscle spasms.  . metoprolol succinate (TOPROL-XL) 100 MG 24 hr tablet Take 1  tablet by mouth daily.   Marland Kitchen. omeprazole (PRILOSEC) 40 MG capsule Take 40 mg by mouth every morning.   . pyridoxine (B-6) 100 MG tablet Take 100 mg by mouth daily.  . vitamin B-12 (CYANOCOBALAMIN) 1000 MCG tablet Take 1,000 mcg by mouth daily.   No facility-administered encounter medications on file as of 10/08/2016.    ALLERGIES: Allergies  Allergen Reactions  . Bee Venom Anaphylaxis    EPIPEN required  . Contrast Media [Iodinated Diagnostic Agents] Nausea Only   VACCINATION STATUS:  There is no immunization history on file for this patient.  HPI  The patient presents today with a medical history as above, and is being seen in f/u with his tests ordered last visit for hyperthyroidism . - He reports improvement in his symptoms including  Weight loss of 10 pounds (regained 6 pounds), palpitations (now pulse rate of 77), and heat intolerance, fatigue, sleep deprivation for several weeks.  - He has no new complaints today.   The patient denies family history of thyroid dysfunction . he patient denies personal history of goiter. Patient is not on any anti-thyroid medications nor on any thyroid hormone supplements. -  His repeat labs show improved TSH of 0.01 from  0.006, free T4 of 1.5 which is within normal  range. - His thyroid uptake and scan showing uniform uptake of 26% (normal study)    Review of Systems Constitutional: + weight gain, + fatigue, + subjective hyperthermia Eyes: no blurry vision, no xerophthalmia ENT: no sore throat, no nodules palpated in throat, no dysphagia/odynophagia, no hoarseness Cardiovascular: no CP/SOB, +palpitations, leg swelling Respiratory: no cough/SOB Gastrointestinal: no N/V/D/C Musculoskeletal: no muscle/joint aches Skin: no rashes Neurological: +tremors,  - numbness/tingling/dizziness Psychiatric: no depression, +anxiety  Objective:    BP 136/78   Pulse 77   Ht 6' (1.829 m)   Wt 233 lb (105.7 kg)   BMI 31.60 kg/m   Wt Readings from Last  3 Encounters:  10/08/16 233 lb (105.7 kg)  09/25/16 227 lb (103 kg)  05/01/16 223 lb (101.2 kg)    Physical Exam Constitutional: overweight, in NAD Eyes: PERRLA, EOMI, no exophthalmos ENT: moist mucous membranes, no thyromegaly, no cervical lymphadenopathy Cardiovascular: RRR, No MRG Respiratory: CTA B Gastrointestinal: abdomen soft, NT, ND, BS+ Musculoskeletal: no deformities, strength intact in all 4 Skin: moist, warm, no rashes Neurological:  Mild tremor with outstretched hands, DTR normal in all 4  Results for orders placed or performed in visit on 09/25/16  Thyroid peroxidase antibody  Result Value Ref Range   Thyroperoxidase Ab SerPl-aCnc <1 <9 IU/mL  Thyroglobulin antibody  Result Value Ref Range   Thyroglobulin Ab <1 <2 IU/mL  TSH  Result Value Ref Range   TSH 0.01 (L) 0.40 - 4.50 mIU/L  T4, free  Result Value Ref Range   Free T4 1.5 0.8 - 1.8 ng/dL   - Nuclear medicine study shows thyroid uptake uniform at 26%.  06/21/2016 TSH 0.175, 08/24/2016  TSH < 0.006   Assessment & Plan:   1. Subclinical Hyperthyroidism - He has clinical improvement since last visit. He studies so far show subclinical hyperthyroidism instead of clinical hyperthyroidism. - Another possibility is subacute thyroiditis with Thyrogen thyrotoxicosis currently on improvement course. - He would not require any antithyroid intervention at this time. He would have repeat labs including free T4, free T3, and TSH in 3 months with office visit.  - I advised patient to maintain close follow up with Ignatius Specking, MD for primary care needs.  Follow up plan: Return in about 3 months (around 01/05/2017) for follow up with pre-visit labs.  Marquis Lunch, MD Phone: [phone]  Fax: [phone]   10/08/2016, 4:00 PM

## 2017-01-09 ENCOUNTER — Ambulatory Visit: Payer: BC Managed Care – PPO | Admitting: "Endocrinology

## 2017-03-05 ENCOUNTER — Ambulatory Visit (INDEPENDENT_AMBULATORY_CARE_PROVIDER_SITE_OTHER): Payer: BC Managed Care – PPO | Admitting: Neurology

## 2017-03-05 ENCOUNTER — Encounter: Payer: Self-pay | Admitting: Neurology

## 2017-03-05 VITALS — BP 129/81 | HR 85 | Ht 72.0 in | Wt 225.2 lb

## 2017-03-05 DIAGNOSIS — I10 Essential (primary) hypertension: Secondary | ICD-10-CM

## 2017-03-05 DIAGNOSIS — I63311 Cerebral infarction due to thrombosis of right middle cerebral artery: Secondary | ICD-10-CM

## 2017-03-05 DIAGNOSIS — G4733 Obstructive sleep apnea (adult) (pediatric): Secondary | ICD-10-CM | POA: Diagnosis not present

## 2017-03-05 DIAGNOSIS — E785 Hyperlipidemia, unspecified: Secondary | ICD-10-CM

## 2017-03-05 DIAGNOSIS — R42 Dizziness and giddiness: Secondary | ICD-10-CM | POA: Diagnosis not present

## 2017-03-05 NOTE — Progress Notes (Signed)
STROKE NEUROLOGY FOLLOW UP NOTE  NAME: Eric Glass DOB: 22-Nov-1960  REASON FOR VISIT: stroke follow up HISTORY FROM: pt and sister  Today we had the pleasure of seeing Eric Glass in follow-up at our Neurology Clinic. Pt was accompanied by sister and wife.   History Summary Eric Glass is a 56 y.o. male, left handed,with PMH of DMH. HLD, HTN, smoker and OSA followed up in clinic for stroke. He was at work on 02/09/2015, round 9am, he was not able to walk, he thought that was due to hot weather, he rested for a few minutes and he felt was back to normal. Around 12 PM, he was eating lunch, found to have left facial droop left-sided drooling, he took a nap and after waking up he felt okay. Around 4 PM, he started to have blurry vision lasted for 30 minutes, while driving home, he found left arm weakness, as well as left leg weakness with slurry speech. He went to [facility], CT of the head did not show any large vessel occlusion, carotid Doppler negative, MRI showed right basal ganglion stroke. He was discharged with Plavix and Lipitor.   He then follow up in our clinic with Dr. Golden Hurter, had TCD and bladder monitoring which was negative, also had a sleep study done showed OSA, and the currently undergoing CPAP treatment. Checked A1c 6.1 and LDL 142. He was continued on Plavix and Lipitor.  Follow up 04/08/15 - the patient has been doing well.  No recurrent stroke like symptoms. He has quit smoking since the admission. Stated that he is compliant with medications, blood pressure and glucose check at home were satisfactory. Blood pressure in clinic today 124/82.  Follow up 07/11/15 - pt has been doing the same. Had 30 day cardiac event monitoring and did not show afib. Also had 2D echo for stroke work up and was unremarkable. He followed with Dr. Golden Hurter for OSA and not tolerating CPAP, was given dental device for mild OSA treatment. He has appointment to get dental device soon.  His glucose tends to be low at home, as low as 50 and symptomatic hypoglycemia with sweating. Normally at home about 80-120. His A1C recently was 6.1. However, he seems in depressed mood. 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 Decreased activity and watching TV all day with daytime sleepiness. Complain of HA everyday, feeling tired and dizziness. He was given meclizine.   Follow up 10/17/15 - pt has been doing better. Not back to work yet. Had nortriptyline since last visit, stated that HA is gone but still has difficulty maintaining sleep at night, wakes up several times. Still has some depressed mood but not bad. BP 132/87 today. He stated that his glucose 120 at home. Still has intermittent left face numbness.   Follow-up 03/05/16 - pt has been doing well from stroke standpoint. He had chest pain and SOB in 12/2015 when mowing his lawn in the heat. He went to [facility] and underwent work up did not show heat attack. he then followed up with Dr. Jacinto Halim as outpt and will do stress test soon. He stated that his BP and glucose controlled well at home. Today BP 124/84. He has stopped nortriptyline and started flomax for urinary frequency. Has back to work full time, complaining of fatigue.   Interval History During the interval time, patient had stress test with Dr. Jacinto Halim which was negative. He stated  occasional CP or SOB for the last 1 year. However, on 02/27/17, he stated that he had some dizziness which characterized as lightheadedness. He thought that was due to dehydration as he mowed the yard the day. However, the second day, he still felt lightheaded, until 03/03/17 he started to have chest pain, he went to [facility] and was admitted one night for workup including CT head, cardiac enzymes, EKG all negative except UDS positive for amphentamine but he denied using any. His BP was 130s and he still complains of lightheadedness on discharge. Today in  clinic is still felt lightheadedness but neuro examination was all normal. He also complains of mild headache yesterday at top of head sharp 8/10, and he took ibuprofen yesterday. BP at home 140s and glucose 120 to 170. He denies anxiety or stress at home, however, he admitted that he lost his job 2 months ago. He seems in depressed mood during clinic, but denies depression. BP today 129/81.  REVIEW OF SYSTEMS: Full 14 system review of systems performed and notable only for those listed below and in HPI above, all others are negative:  Constitutional:   Cardiovascular:  Ear/Nose/Throat:   Skin:  Eyes:   Respiratory:   Gastroitestinal:   Genitourinary:  Hematology/Lymphatic:   Endocrine:  Musculoskeletal:  Allergy/Immunology:   Neurological:   dizziness, headache, numbness, speech difficulty  Psychiatric:  Sleep:   The following represents the patient's updated allergies and side effects list: Allergies  Allergen Reactions  . Bee Venom Anaphylaxis    EPIPEN required  . Contrast Media [Iodinated Diagnostic Agents] Nausea Only     The neurologically relevant items on the patient's problem list were reviewed on today's visit.  Neurologic Examination  A problem focused neurological exam (12 or more points of the single system neurologic examination, vital signs counts as 1 point, cranial nerves count for 8 points) was performed.  Blood pressure 129/81, pulse 85, height 6' (1.829 m), weight 225 lb 3.2 oz (102.2 kg).  General - Well nourished, well developed, in no apparent distress.  Ophthalmologic - Sharp disc margins OU.  Cardiovascular - Regular rate and rhythm with no murmur.  Mental Status -  Level of arousal and orientation to time, place, and person were intact. Language including expression, naming, repetition, comprehension was assessed and found intact. Fund of Knowledge was assessed and was intact.  Cranial Nerves II - XII - II - Visual field intact OU. III, IV,  VI - Extraocular movements intact. V - Facial sensation intact bilaterally. VII - facial movement symmetrical VIII - Hearing & vestibular intact bilaterally. X - Palate elevates symmetrically. XI - Chin turning & shoulder shrug intact bilaterally. XII - Tongue protrusion intact.  Motor Strength - The patient's strength was normal in all extremities and pronator drift was absent.  Bulk was normal and fasciculations were absent.   Motor Tone - Muscle tone was assessed at the neck and appendages and was normal.  Reflexes - The patient's reflexes were 1+ in all extremities and he had no pathological reflexes.  Sensory - Light touch, temperature/pinprick, vibration and proprioception, and Romberg test were assessed and were normal.   Coordination - The patient had normal movements in the hands and feet with no ataxia or dysmetria.  Tremor was absent.  Gait and Station - The patient's transfers, posture, gait, station, and turns were observed as normal.  Data reviewed: I personally reviewed the images and agree with the radiology interpretations.  MRI 02/10/15 - acute right MCA  territory infarct, mainly involve right lentiform nuclei  CTA head 02/10/15 - no large vessel occlusion  Carotid Doppler - negative for bilateral ICA stenosis  TCD emboli detection - negative for microemboli  Sleep study - positive for OSA  CT head 02/01/17 - no acute abnormality   Component     Latest Ref Rng 02/15/2015  Cholesterol, Total     100 - 199 mg/dL 829200 (H)  Triglycerides     0 - 149 mg/dL 562182 (H)  HDL Cholesterol     >39 mg/dL 22 (L)  VLDL Cholesterol Cal     5 - 40 mg/dL 36  LDL (calc)     0 - 99 mg/dL 130142 (H)  Total CHOL/HDL Ratio     0.0 - 5.0 ratio units 9.1 (H)   A1c 6.1, Creatinine 1.09, glucose 104   Assessment: As you may recall, he is a 56 y.o. African American male with PMH of diabetes, hypertension, hyperlipidemia, smoker, OSA followed up in clinic for right MCA territory infarct  occurred in 01/2015. Stroke location consistent with small vessel disease, however size a little bit bigger than normal lacunar infarct. Patient has significant stroke risk factors including HTN, DM, HLD, OSA, smoker, and obesity. CUS, CTA head, 2D echo and 30 day monitoring unremarkable. LDL 142 and A1C 6.1. On plavix and lipitor for stroke prevention. Follows with Dr. Golden Hurterohemier for OSA and had dental device at night. Has quit smoking. Found to be in depressed mood with HA and insomnia. He was put on nortriptyline low dose. The HA much improved, but still has mild depression and insomnia. And nortriptyline was increased to 50mg  Qhs. However, during interval time, he stopped nortriptyline by himself. He was back to work and feeding well. Had episode of CP and SOB, followed with Dr. Jacinto HalimGanji and stress test negative. Complains of lightheadedness recently, [facility] and workup all negative except UDS positive for amphentermine which he denied using. Still complains of lightheadedness with normal neuro exam, most likely related to depression and anxiety as he has lost his job 2 months ago.   Plan:  - continue plavix and lipitor for stroke prevention - Continue to abstain from smoking - continue to use dental device for OSA  - lightheadedness and HA more likely due to tension headache. Relaxation and de-stress yourself are still the key  - Follow up with your primary care physician for stroke risk factor modification. Recommend maintain blood pressure goal around 130/80, diabetes with hemoglobin A1c goal below 6.5% and lipids with LDL cholesterol goal below 70 mg/dL.  - check BP and glucose at home - healthy diabetic diet and regular exercises - follow up in 6 months with Eber Jonesarolyn and consider d/c if stable at that time.  I spent more than 25 minutes of face to face time with the patient. Greater than 50% of time was spent in counseling and coordination of care. We have discussed etiology of  lightheadedness, BP and glucose monitoring and destress and relaxation.   No orders of the defined types were placed in this encounter.   Meds ordered this encounter  Medications  . DISCONTD: glipiZIDE (GLUCOTROL XL) 10 MG 24 hr tablet  . meclizine (ANTIVERT) 25 MG tablet  . DISCONTD: diclofenac (VOLTAREN) 75 MG EC tablet  . DEXILANT 60 MG capsule  . DISCONTD: ranitidine (ZANTAC) 150 MG tablet    Patient Instructions  - continue plavix and lipitor for stroke prevention - Continue to abstain from smoking - continue to  use dental device for OSA  - your lightheadedness and HA more likely due to tension headache. Relaxation and de-stress yourself are still the key - keep hydrated  - let us know if you need medication or psychology help for depression if you have  - Follow up with your primary care physician for stroke risk factor modification. Recommend maintain blood pressure goal around 130/80, diabetes with hemoglobin A1c goal below 6.5% and lipids with LDL cholesterol goal below 70 mg/dL.  - check BP and glucose at home - healthy diabetic diet and regular exercises - follow up in 6 months.   Marvel Plan, MD PhD [facility] [address] [place], [place] [address] [phone]

## 2017-03-05 NOTE — Patient Instructions (Addendum)
-   continue plavix and lipitor for stroke prevention - Continue to abstain from smoking - continue to use dental device for OSA  - your lightheadedness and HA more likely due to tension headache. Relaxation and de-stress yourself are still the key - keep hydrated  - let us know if you need medication or psychology help for depression if you have  - Follow up with your primary care physician for stroke risk factor modification. Recommend maintain blood pressure goal around 130/80, diabetes with hemoglobin A1c goal below 6.5% and lipids with LDL cholesterol goal below 70 mg/dL.  - check BP and glucose at home - healthy diabetic diet and regular exercises - follow up in 6 months.

## 2017-09-04 NOTE — Progress Notes (Signed)
[facility]  PATIENT: Eric Glass DOB: 07-13-1961   REASON FOR VISIT: Follow-up for history of stroke HISTORY FROM: Patient and wife Eric Glass    HISTORY OF PRESENT ILLNESS:Eric Glass is a 57 y.o. male, left handed,with PMH of DMH. HLD, HTN, smoker and OSA followed up in clinic for stroke. He was at work on 02/09/2015, round 9am, he was not able to walk, he thought that was due to hot weather, he rested for a few minutes and he felt was back to normal. Around 12 PM, he was eating lunch, found to have left facial droop left-sided drooling, he took a nap and after waking up he felt okay. Around 4 PM, he started to have blurry vision lasted for 30 minutes, while driving home, he found left arm weakness, as well as left leg weakness with slurry speech. He went to [facility], CT of the head did not show any large vessel occlusion, carotid Doppler negative, MRI showed right basal ganglion stroke. He was discharged with Plavix and Lipitor.   He then follow up in our clinic with Dr. Golden Hurter, had TCD and bladder monitoring which was negative, also had a sleep study done showed OSA, and the currently undergoing CPAP treatment. Checked A1c 6.1 and LDL 142. He was continued on Plavix and Lipitor.  Follow up 04/08/15 - the patient has been doing well.  No recurrent stroke like symptoms. He has quit smoking since the admission. Stated that he is compliant with medications, blood pressure and glucose check at home were satisfactory. Blood pressure in clinic today 124/82.  Follow up 07/11/15 - pt has been doing the same. Had 30 day cardiac event monitoring and did not show afib. Also had 2D echo for stroke work up and was unremarkable. He followed with Dr. Golden Hurter for OSA and not tolerating CPAP, was given dental device for mild OSA treatment. He has appointment to get dental device soon. His glucose tends to be low at home, as low as 50 and symptomatic hypoglycemia with  sweating. Normally at home about 80-120. His A1C recently was 6.1. However, he seems in depressed mood. 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 Decreased activity and watching TV all day with daytime sleepiness. Complain of HA everyday, feeling tired and dizziness. He was given meclizine.   Follow up 10/17/15 - pt has been doing better. Not back to work yet. Had nortriptyline since last visit, stated that HA is gone but still has difficulty maintaining sleep at night, wakes up several times. Still has some depressed mood but not bad. BP 132/87 today. He stated that his glucose 120 at home. Still has intermittent left face numbness.   Follow-up 03/05/16 - pt has been doing well from stroke standpoint. He had chest pain and SOB in 12/2015 when mowing his lawn in the heat. He went to [facility] and underwent work up did not show heat attack. he then followed up with Dr. Jacinto Halim as outpt and will do stress test soon. He stated that his BP and glucose controlled well at home. Today BP 124/84. He has stopped nortriptyline and started flomax for urinary frequency. Has back to work full time, complaining of fatigue.   Interval History7/10/18 Dr. Roda Shutters During the interval time, patient had stress test with Dr. Jacinto Halim which was negative. He stated occasional CP or SOB for the last 1 year. However, on 02/27/17, he stated that he had some  dizziness which characterized as lightheadedness. He thought that was due to dehydration as he mowed the yard the day. However, the second day, he still felt lightheaded, until 03/03/17 he started to have chest pain, he went to [facility] and was admitted one night for workup including CT head, cardiac enzymes, EKG all negative except UDS positive for amphentamine but he denied using any. His BP was 130s and he still complains of lightheadedness on discharge. Today in clinic is still felt lightheadedness but neuro examination was all  normal. He also complains of mild headache yesterday at top of head sharp 8/10, and he took ibuprofen yesterday. BP at home 140s and glucose 120 to 170. He denies anxiety or stress at home, however, he admitted that he lost his job 2 months ago. He seems in depressed mood during clinic, but denies depression. BP today 129/81. UPDATE 1/10/2019CM Mr. Eric Glass 58 year old male returns for follow-up.  He has a history of stroke event in June 2016.  He has not had further stroke or TIA symptoms.  He remains on Plavix for secondary stroke prevention with no bruising and no bleeding.  He remains on Lipitor without myalgias he is back to work full-time.  Is also diabetic checks his blood sugars generally run between 120 and 130.  He continues to complain with some intermittent dizziness which is characterized as lightheadedness.  Wife says he does not drink enough water and he has been encouraged to do so.  He continues to follow with Dr. Nadara Eaton cardiologist.  He gets little exercise.  He denies any depression.  He does use a dental appliance for obstructive sleep apnea.  Blood pressure in the office today 149/89.  He returns for reevaluation without new neurologic complaints.  He does complain of elbow pain and has an appointment with his physician in a couple weeks.  He has a history of gout  REVIEW OF SYSTEMS: Full 14 system review of systems performed and notable only for those listed, all others are neg:  Constitutional: neg  Cardiovascular: neg Ear/Nose/Throat: Hearing loss Skin: neg Eyes: Wears glasses Respiratory: neg Gastroitestinal: neg  Hematology/Lymphatic: neg  Endocrine: neg Musculoskeletal:neg Allergy/Immunology: neg Neurological: Dizziness Psychiatric: neg Sleep : neg   ALLERGIES: Allergies  Allergen Reactions  . Bee Venom Anaphylaxis    EPIPEN required  . Contrast Media [Iodinated Diagnostic Agents] Nausea Only    HOME MEDICATIONS: Outpatient Medications Prior to Visit    Medication Sig Dispense Refill  . alfuzosin (UROXATRAL) 10 MG 24 hr tablet Take 10 mg by mouth daily with breakfast.    . amLODipine-benazepril (LOTREL) 10-20 MG per capsule Take 1 capsule by mouth daily.    Marland Kitchen atorvastatin (LIPITOR) 10 MG tablet Take 10 mg by mouth every evening.     . clopidogrel (PLAVIX) 75 MG tablet TAKE ONE TABLET BY MOUTH ONCE DAILY 90 tablet 3  . DEXILANT 60 MG capsule     . glipiZIDE (GLUCOTROL) 5 MG tablet Take 10 mg by mouth 2 (two) times daily before a meal.     . meclizine (ANTIVERT) 25 MG tablet     . metFORMIN (GLUCOPHAGE) 500 MG tablet Take 1-2 tablets (500-1,000 mg total) by mouth See admin instructions. 2 tablets (1000 mg) in the morning, and 1 tablet (500 mg) every evening    . methocarbamol (ROBAXIN) 500 MG tablet Take 500 mg by mouth every 8 (eight) hours as needed for muscle spasms.    . metoprolol succinate (TOPROL-XL) 100 MG 24 hr tablet Take  1 tablet by mouth daily.     . vitamin B-12 (CYANOCOBALAMIN) 1000 MCG tablet Take 1,000 mcg by mouth daily.     No facility-administered medications prior to visit.     PAST MEDICAL HISTORY: Past Medical History:  Diagnosis Date  . Arthritis   . Chest pain   . CVA (cerebral infarction)   . Diabetes mellitus without complication (HCC)   . Headache   . Hypertension   . Neck pain   . Stroke [facility])     PAST SURGICAL HISTORY: Past Surgical History:  Procedure Laterality Date  . CARDIAC CATHETERIZATION N/A 05/01/2016   Procedure: Left Heart Cath and Coronary Angiography;  Surgeon: Yates Decamp, MD;  Location: [facility] INVASIVE CV LAB;  Service: Cardiovascular;  Laterality: N/A;  . rotator cuff replacement Left     FAMILY HISTORY: Family History  Problem Relation Age of Onset  . Stroke Father   . Heart disease Father   . Seizures Paternal Uncle     SOCIAL HISTORY: Social History   Socioeconomic History  . Marital status: Married    Spouse name: Not on file  . Number of children: Not on file  . Years of  education: Not on file  . Highest education level: Not on file  Social Needs  . Financial resource strain: Not on file  . Food insecurity - worry: Not on file  . Food insecurity - inability: Not on file  . Transportation needs - medical: Not on file  . Transportation needs - non-medical: Not on file  Occupational History  . Not on file  Tobacco Use  . Smoking status: Former Smoker    Last attempt to quit: 02/09/2015    Years since quitting: 2.5  . Smokeless tobacco: Never Used  Substance and Sexual Activity  . Alcohol use: No    Alcohol/week: 0.0 oz  . Drug use: No  . Sexual activity: Not on file  Other Topics Concern  . Not on file  Social History Narrative   Drinks 1 cup of caffeine daily.     PHYSICAL EXAM  Vitals:   09/05/17 1025  BP: (!) 149/89  Pulse: 77  Weight: 229 lb (103.9 kg)   Body mass index is 31.06 kg/m.  Generalized: Well developed, in no acute distress  Head: normocephalic and atraumatic,. Oropharynx benign  Neck: Supple, no carotid bruits  Cardiac: Regular rate rhythm, no murmur  Musculoskeletal: No deformity   Neurological examination   Mentation: Alert oriented to time, place, history taking. Attention span and concentration appropriate. Recent and remote memory intact.  Follows all commands speech and language fluent.   Cranial nerve II-XII: Pupils were equal round reactive to light extraocular movements were full, visual field were full on confrontational test. Facial sensation and strength were normal. hearing was intact to finger rubbing bilaterally. Uvula tongue midline. head turning and shoulder shrug were normal and symmetric.Tongue protrusion into cheek strength was normal. Motor: normal bulk and tone, full strength in the BUE, BLE, fine finger movements normal, no pronator drift. No focal weakness Sensory: normal and symmetric to light touch, pinprick, and  Vibration, in the upper and lower extremities Coordination: finger-nose-finger,  heel-to-shin bilaterally, no dysmetria Reflexes: 1+ upper lower and symmetric plantar responses were flexor bilaterally. Gait and Station: Rising up from seated position without assistance, normal stance,  moderate stride, good arm swing, smooth turning, able to perform tiptoe, and heel walking without difficulty. Tandem gait is steady  DIAGNOSTIC DATA (LABS, IMAGING, TESTING) - I reviewed  patient records, labs, notes, testing and imaging myself where available.  No results found for: WBC, HGB, HCT, MCV, PLT    Component Value Date/Time   K 3.1 (L) 05/01/2016 0745   Lab Results  Component Value Date   CHOL 200 (H) 02/15/2015   HDL 22 (L) 02/15/2015   LDLCALC 142 (H) 02/15/2015   TRIG 182 (H) 02/15/2015   CHOLHDL 9.1 (H) 02/15/2015   No results found for: HGBA1C No results found for: VITAMINB12 Lab Results  Component Value Date   TSH 0.01 (L) 09/25/2016      ASSESSMENT AND PLAN 57 y.o. African American male with PMH of diabetes, hypertension, hyperlipidemia, smoker, OSA followed up in clinic for right MCA territory infarct occurred in 01/2015. Stroke location consistent with small vessel disease, however size a little bit bigger than normal lacunar infarct. Patient has significant stroke risk factors including HTN, DM, HLD, OSA, smoker, and obesity. CUS, CTA head, 2D echo and 30 day monitoring unremarkable. LDL 142 and A1C 6.1. On plavix and lipitor for stroke prevention. Follows with Dr. Golden Hurterohemier for OSA and had dental device at night. Has quit smoking.  He was back to work and feeling well. Had episode of CP and SOB, followed with Dr. Jacinto HalimGanji and stress test negative. Complains of lightheadedness recently, [facility] and workup all negative except UDS positive for amphentermine which he denied using. Still complains of lightheadedness with normal neuro exam, most likely related to dehydration as wife says he drinks very little water or fluids The patient is a current  patient of Dr. Roda ShuttersXu who is out of the office today . This note is sent to the work in doctor.     .  Plan: Stressed the importance of management of risk factors to prevent further stroke Continue Plavix and Lipitor for secondary stroke prevention Maintain strict control of hypertension with blood pressure goal below 130/90, today's [phone]/89 continue antihypertensive medications Control of diabetes with hemoglobin A1c below 6.5 followed by primary care continue diabetic medications Cholesterol with LDL cholesterol less than 70, followed by primary care,   Exercise by walking,   eat healthy diet with whole grains,  fresh fruits and vegetables Dental device for obstructive sleep apnea Stay well-hydrated, drink more water Follow up with your primary care physician for stroke risk factor modification. Recommend maintain blood pressure goal around 130/80, diabetes with hemoglobin A1c goal below 6.5% and lipids with LDL cholesterol goal below 70 mg/dL.  Continue to check blood pressure and blood sugar at home We will discharge from stroke clinic I spent 25 minutes in total face to face time with the patient more than 50% of which was spent counseling and coordination of care, reviewing test results reviewing medications and discussing and reviewing the diagnosis of stroke and management of risk factors.  Note written information given Nilda RiggsNancy Carolyn Martin, [facility] CenterGNP, [facility], APRN  [facility] [address], [address] [place], [place] [id number] [phone]

## 2017-09-05 ENCOUNTER — Encounter: Payer: Self-pay | Admitting: Nurse Practitioner

## 2017-09-05 ENCOUNTER — Ambulatory Visit: Payer: BC Managed Care – PPO | Admitting: Nurse Practitioner

## 2017-09-05 VITALS — BP 149/89 | HR 77 | Wt 229.0 lb

## 2017-09-05 DIAGNOSIS — I10 Essential (primary) hypertension: Secondary | ICD-10-CM | POA: Diagnosis not present

## 2017-09-05 DIAGNOSIS — Z8673 Personal history of transient ischemic attack (TIA), and cerebral infarction without residual deficits: Secondary | ICD-10-CM | POA: Diagnosis not present

## 2017-09-05 DIAGNOSIS — E785 Hyperlipidemia, unspecified: Secondary | ICD-10-CM | POA: Diagnosis not present

## 2017-09-05 DIAGNOSIS — G4733 Obstructive sleep apnea (adult) (pediatric): Secondary | ICD-10-CM | POA: Diagnosis not present

## 2017-09-05 NOTE — Patient Instructions (Addendum)
Stressed the importance of management of risk factors to prevent further stroke Continue Plavix and Lipitor for secondary stroke prevention Maintain strict control of hypertension with blood pressure goal below 130/90, today's [phone]/89 continue antihypertensive medications Control of diabetes with hemoglobin A1c below 6.5 followed by primary care continue diabetic medications Cholesterol with LDL cholesterol less than 70, followed by primary care,   Exercise by walking,   eat healthy diet with whole grains,  fresh fruits and vegetables Dental device for obstructive sleep apnea Stay well-hydrated Follow up with your primary care physician for stroke risk factor modification. Recommend maintain blood pressure goal around 130/80, diabetes with hemoglobin A1c goal below 6.5% and lipids with LDL cholesterol goal below 70 mg/dL.  Continue to check blood pressure and blood sugar at home We will discharge from stroke clinic  Stroke Prevention Some medical conditions and behaviors are associated with a higher chance of having a stroke. You can help prevent a stroke by making nutrition, lifestyle, and other changes, including managing any medical conditions you may have. What nutrition changes can be made?  Eat healthy foods. You can do this by: ? Choosing foods high in fiber, such as fresh fruits and vegetables and whole grains. ? Eating at least 5 or more servings of fruits and vegetables a day. Try to fill half of your plate at each meal with fruits and vegetables. ? Choosing lean protein foods, such as lean cuts of meat, poultry without skin, fish, tofu, beans, and nuts. ? Eating low-fat dairy products. ? Avoiding foods that are high in salt (sodium). This can help lower blood pressure. ? Avoiding foods that have saturated fat, trans fat, and cholesterol. This can help prevent high cholesterol. ? Avoiding processed and premade foods.  Follow your health care provider's specific guidelines  for losing weight, controlling high blood pressure (hypertension), lowering high cholesterol, and managing diabetes. These may include: ? Reducing your daily calorie intake. ? Limiting your daily sodium intake to 1,500 milligrams (mg). ? Using only healthy fats for cooking, such as olive oil, canola oil, or sunflower oil. ? Counting your daily carbohydrate intake. What lifestyle changes can be made?  Maintain a healthy weight. Talk to your health care provider about your ideal weight.  Get at least 30 minutes of moderate physical activity at least 5 days a week. Moderate activity includes brisk walking, biking, and swimming.  Do not use any products that contain nicotine or tobacco, such as cigarettes and e-cigarettes. If you need help quitting, ask your health care provider. It may also be helpful to avoid exposure to secondhand smoke.  Limit alcohol intake to no more than 1 drink a day for nonpregnant women and 2 drinks a day for men. One drink equals 12 oz of beer, 5 oz of wine, or 1 oz of hard liquor.  Stop any illegal drug use.  Avoid taking birth control pills. Talk to your health care provider about the risks of taking birth control pills if: ? You are over 56 years old. ? You smoke. ? You get migraines. ? You have ever had a blood clot. What other changes can be made?  Manage your cholesterol levels. ? Eating a healthy diet is important for preventing high cholesterol. If cholesterol cannot be managed through diet alone, you may also need to take medicines. ? Take any prescribed medicines to control your cholesterol as told by your health care provider.  Manage your diabetes. ? Eating a healthy diet and exercising regularly are important  parts of managing your blood sugar. If your blood sugar cannot be managed through diet and exercise, you may need to take medicines. ? Take any prescribed medicines to control your diabetes as told by your health care provider.  Control your  hypertension. ? To reduce your risk of stroke, try to keep your blood pressure below 130/80. ? Eating a healthy diet and exercising regularly are an important part of controlling your blood pressure. If your blood pressure cannot be managed through diet and exercise, you may need to take medicines. ? Take any prescribed medicines to control hypertension as told by your health care provider. ? Ask your health care provider if you should monitor your blood pressure at home. ? Have your blood pressure checked every year, even if your blood pressure is normal. Blood pressure increases with age and some medical conditions.  Get evaluated for sleep disorders (sleep apnea). Talk to your health care provider about getting a sleep evaluation if you snore a lot or have excessive sleepiness.  Take over-the-counter and prescription medicines only as told by your health care provider. Aspirin or blood thinners (antiplatelets or anticoagulants) may be recommended to reduce your risk of forming blood clots that can lead to stroke.  Make sure that any other medical conditions you have, such as atrial fibrillation or atherosclerosis, are managed. What are the warning signs of a stroke? The warning signs of a stroke can be easily remembered as BEFAST.  B is for balance. Signs include: ? Dizziness. ? Loss of balance or coordination. ? Sudden trouble walking.  E is for eyes. Signs include: ? A sudden change in vision. ? Trouble seeing.  F is for face. Signs include: ? Sudden weakness or numbness of the face. ? The face or eyelid drooping to one side.  A is for arms. Signs include: ? Sudden weakness or numbness of the arm, usually on one side of the body.  S is for speech. Signs include: ? Trouble speaking (aphasia). ? Trouble understanding.  T is for time. ? These symptoms may represent a serious problem that is an emergency. Do not wait to see if the symptoms will go away. Get medical help right  away. Call your local emergency services (911 in the [place] Do not drive yourself to the hospital.  Other signs of stroke may include: ? A sudden, severe headache with no known cause. ? Nausea or vomiting. ? Seizure.  Where to find more information: For more information, visit:  American Stroke Association: www.strokeassociation.org  National Stroke Association: www.stroke.org  Summary  You can prevent a stroke by eating healthy, exercising, not smoking, limiting alcohol intake, and managing any medical conditions you may have.  Do not use any products that contain nicotine or tobacco, such as cigarettes and e-cigarettes. If you need help quitting, ask your health care provider. It may also be helpful to avoid exposure to secondhand smoke.  Remember BEFAST for warning signs of stroke. Get help right away if you or a loved one has any of these signs. This information is not intended to replace advice given to you by your health care provider. Make sure you discuss any questions you have with your health care provider. Document Released: 09/20/2004 Document Revised: 09/18/2016 Document Reviewed: 09/18/2016 Elsevier Interactive Patient Education  [employer].

## 2017-09-05 NOTE — Progress Notes (Signed)
I have reviewed and agreed above plan. 

## 2017-09-16 LAB — TSH: TSH: 0.01 — AB (ref <=5.90)

## 2017-10-10 ENCOUNTER — Ambulatory Visit: Payer: BC Managed Care – PPO | Admitting: "Endocrinology

## 2017-11-05 ENCOUNTER — Ambulatory Visit (INDEPENDENT_AMBULATORY_CARE_PROVIDER_SITE_OTHER): Payer: BC Managed Care – PPO | Admitting: "Endocrinology

## 2017-11-05 ENCOUNTER — Encounter: Payer: Self-pay | Admitting: "Endocrinology

## 2017-11-05 VITALS — BP 139/80 | HR 88 | Ht 72.0 in | Wt 223.0 lb

## 2017-11-05 DIAGNOSIS — E0590 Thyrotoxicosis, unspecified without thyrotoxic crisis or storm: Secondary | ICD-10-CM | POA: Diagnosis not present

## 2017-11-05 NOTE — Progress Notes (Signed)
Subjective:    Patient ID: Eric Glass, male    DOB: Oct 25, 1960, PCP Ignatius Specking, MD.   Past Medical History:  Diagnosis Date  . Arthritis   . Chest pain   . CVA (cerebral infarction)   . Diabetes mellitus without complication (HCC)   . Headache   . Hypertension   . Neck pain   . Stroke [facility])    Past Surgical History:  Procedure Laterality Date  . CARDIAC CATHETERIZATION N/A 05/01/2016   Procedure: Left Heart Cath and Coronary Angiography;  Surgeon: Yates Decamp, MD;  Location: [facility] INVASIVE CV LAB;  Service: Cardiovascular;  Laterality: N/A;  . rotator cuff replacement Left    Social History   Socioeconomic History  . Marital status: Married    Spouse name: None  . Number of children: None  . Years of education: None  . Highest education level: None  Social Needs  . Financial resource strain: None  . Food insecurity - worry: None  . Food insecurity - inability: None  . Transportation needs - medical: None  . Transportation needs - non-medical: None  Occupational History  . None  Tobacco Use  . Smoking status: Former Smoker    Last attempt to quit: 02/09/2015    Years since quitting: 2.7  . Smokeless tobacco: Never Used  Substance and Sexual Activity  . Alcohol use: No    Alcohol/week: 0.0 oz  . Drug use: No  . Sexual activity: None  Other Topics Concern  . None  Social History Narrative   Drinks 1 cup of caffeine daily.   Outpatient Encounter Medications as of 11/05/2017  Medication Sig  . dapagliflozin propanediol (FARXIGA) 10 MG TABS tablet Take 10 mg by mouth daily.  Marland Kitchen alfuzosin (UROXATRAL) 10 MG 24 hr tablet Take 10 mg by mouth daily with breakfast.  . amLODipine-benazepril (LOTREL) 10-20 MG per capsule Take 1 capsule by mouth daily.  Marland Kitchen atorvastatin (LIPITOR) 10 MG tablet Take 10 mg by mouth every evening.   . clopidogrel (PLAVIX) 75 MG tablet TAKE ONE TABLET BY MOUTH ONCE DAILY  . DEXILANT 60 MG capsule   . glipiZIDE (GLUCOTROL) 5 MG tablet Take 10  mg by mouth 2 (two) times daily before a meal.   . meclizine (ANTIVERT) 25 MG tablet   . metFORMIN (GLUCOPHAGE) 500 MG tablet Take 1-2 tablets (500-1,000 mg total) by mouth See admin instructions. 2 tablets (1000 mg) in the morning, and 1 tablet (500 mg) every evening  . methocarbamol (ROBAXIN) 500 MG tablet Take 500 mg by mouth every 8 (eight) hours as needed for muscle spasms.  . metoprolol succinate (TOPROL-XL) 100 MG 24 hr tablet Take 1 tablet by mouth daily.   . vitamin B-12 (CYANOCOBALAMIN) 1000 MCG tablet Take 1,000 mcg by mouth daily.   No facility-administered encounter medications on file as of 11/05/2017.    ALLERGIES: Allergies  Allergen Reactions  . Bee Venom Anaphylaxis    EPIPEN required  . Contrast Media [Iodinated Diagnostic Agents] Nausea Only   VACCINATION STATUS:  There is no immunization history on file for this patient.  HPI  This patient was seen last year for subclinical hyperthyroidism.  He was supposed to return in May 2018 with repeat labs, however he disappeared from care.  -Around December 2018 he started to reexperience symptoms including palpitations, tremors, sleep disturbance, heat intolerance, and weight loss of 10 pounds.   -His repeat labs in January showed suppressed TSH of 0.006 associated with high free  T4 of 1.5 9.  He is not initiated on any treatment.    The patient denies family history of thyroid dysfunction , or thyroid cancer. he patient denies personal history of goiter.   - His thyroid uptake and scan last year in February 2018 showed uniform uptake of 26%.     Review of Systems Constitutional: + weight loss, + fatigue, + subjective hyperthermia Eyes: no blurry vision, no xerophthalmia ENT: no sore throat, no nodules palpated in throat, no dysphagia/odynophagia, no hoarseness Cardiovascular: no chest pain, no shortness of breath, +palpitations, leg swelling Respiratory: no cough/SOB Gastrointestinal: no N/V/D/C Musculoskeletal: no  muscle/joint aches Skin: no rashes Neurological: +tremors,  - numbness/tingling/dizziness Psychiatric: no depression, +anxiety  Objective:    BP 139/80   Pulse 88   Ht 6' (1.829 m)   Wt 223 lb (101.2 kg)   BMI 30.24 kg/m   Wt Readings from Last 3 Encounters:  11/05/17 223 lb (101.2 kg)  09/05/17 229 lb (103.9 kg)  03/05/17 225 lb 3.2 oz (102.2 kg)    Physical Exam Constitutional: overweight, not in acute distress.   Eyes: PERRLA, EOMI, no exophthalmos ENT: moist mucous membranes, no thyromegaly, no cervical lymphadenopathy Cardiovascular: RRR, No MRG Respiratory: CTA B Gastrointestinal: abdomen soft, NT, ND, BS+ Musculoskeletal: no deformities, strength intact in all 4 Skin: moist, warm, no rashes Neurological:  + tremor with outstretched hands, DTR normal in all 4  Results for orders placed or performed in visit on 11/05/17  TSH  Result Value Ref Range   TSH 0.01 (A) 0.41 - 5.90   - Nuclear medicine study in February 2018 showed uniform uptake of 26% . 06/21/2016 TSH 0.175, 08/24/2016  TSH < 0.006. -   Assessment & Plan:   1. Hyperthyroidism -His previsit labs are still consistent with subclinical hyperthyroidism, however patient has symptoms. -He is proven unreliable for follow-up, DC his appointment since February 2018. -I would proceed to repeat thyroid uptake and scan. -His workup confirms hyper thyroidism, will be considered for definitive treatment. -Return in 7-10 days for reevaluation. -The meantime, I advised him to be consistent taking his Toprol 100 mg XL which is helping for symptoms.  - I advised patient to maintain close follow up with Doreen BeamVyas, Dhruv B, MD for primary care needs.  Follow up plan: Return in about 10 days (around 11/15/2017) for follow up with thyroid uptake and scan.  Marquis LunchGebre Nida, MD Phone: [phone]  Fax: [phone]  -  This note was partially dictated with voice recognition software. Similar sounding words can be transcribed  inadequately or may not  be corrected upon review.  11/05/2017, 5:04 PM

## 2017-11-14 ENCOUNTER — Encounter (HOSPITAL_COMMUNITY)
Admission: RE | Admit: 2017-11-14 | Discharge: 2017-11-14 | Disposition: A | Discharge status: Home / Self Care | Payer: BC Managed Care – PPO | Source: Ambulatory Visit | Attending: "Endocrinology | Admitting: "Endocrinology

## 2017-11-14 ENCOUNTER — Encounter (HOSPITAL_COMMUNITY): Payer: Self-pay

## 2017-11-14 DIAGNOSIS — E0590 Thyrotoxicosis, unspecified without thyrotoxic crisis or storm: Secondary | ICD-10-CM | POA: Diagnosis not present

## 2017-11-14 MED ORDER — SODIUM IODIDE I-123 7.4 MBQ CAPS
400.0000 | ORAL_CAPSULE | Freq: Once | ORAL | Status: AC
  Administered 2017-11-14: 428 via ORAL

## 2017-11-15 ENCOUNTER — Encounter (HOSPITAL_COMMUNITY)
Admission: RE | Admit: 2017-11-15 | Discharge: 2017-11-15 | Disposition: A | Discharge status: Home / Self Care | Payer: BC Managed Care – PPO | Source: Ambulatory Visit | Attending: "Endocrinology | Admitting: "Endocrinology

## 2017-11-20 ENCOUNTER — Encounter: Payer: Self-pay | Admitting: "Endocrinology

## 2017-11-20 ENCOUNTER — Ambulatory Visit (INDEPENDENT_AMBULATORY_CARE_PROVIDER_SITE_OTHER): Payer: BC Managed Care – PPO | Admitting: "Endocrinology

## 2017-11-20 VITALS — BP 132/86 | HR 83 | Ht 72.0 in | Wt 224.0 lb

## 2017-11-20 DIAGNOSIS — E1165 Type 2 diabetes mellitus with hyperglycemia: Secondary | ICD-10-CM

## 2017-11-20 DIAGNOSIS — E0590 Thyrotoxicosis, unspecified without thyrotoxic crisis or storm: Secondary | ICD-10-CM

## 2017-11-20 MED ORDER — ACCU-CHEK AVIVA DEVI: 0 refills | Status: AC

## 2017-11-20 MED ORDER — GLUCOSE BLOOD VI STRP: ORAL_STRIP | 3 refills | Status: AC

## 2017-11-20 NOTE — Patient Instructions (Signed)

## 2017-11-20 NOTE — Progress Notes (Signed)
Subjective:    Patient ID: Eric Glass, male    DOB: 10/13/1960, PCP Ignatius SpeckingVyas, Dhruv B, MD.   Past Medical History:  Diagnosis Date  . Arthritis   . Chest pain   . CVA (cerebral infarction)   . Diabetes mellitus without complication (HCC)   . Headache   . Hypertension   . Neck pain   . Stroke [facility](HCC)    Past Surgical History:  Procedure Laterality Date  . CARDIAC CATHETERIZATION N/A 05/01/2016   Procedure: Left Heart Cath and Coronary Angiography;  Surgeon: Yates DecampJay Ganji, MD;  [facility];  Service: Cardiovascular;  Laterality: N/A;  . rotator cuff replacement Left    Social History   Socioeconomic History  . Marital status: Married    Spouse name: Not on file  . Number of children: Not on file  . Years of education: Not on file  . Highest education level: Not on file  Occupational History  . Not on file  Social Needs  . Financial resource strain: Not on file  . Food insecurity:    Worry: Not on file    Inability: Not on file  . Transportation needs:    Medical: Not on file    Non-medical: Not on file  Tobacco Use  . Smoking status: Former Smoker    Last attempt to quit: 02/09/2015    Years since quitting: 2.7  . Smokeless tobacco: Never Used  Substance and Sexual Activity  . Alcohol use: No    Alcohol/week: 0.0 oz  . Drug use: No  . Sexual activity: Not on file  Lifestyle  . Physical activity:    Days per week: Not on file    Minutes per session: Not on file  . Stress: Not on file  Relationships  . Social connections:    Talks on phone: Not on file    Gets together: Not on file    Attends religious service: Not on file    Active member of club or organization: Not on file    Attends meetings of clubs or organizations: Not on file    Relationship status: Not on file  Other Topics Concern  . Not on file  Social History Narrative   Drinks 1 cup of caffeine daily.   Outpatient Encounter Medications as of 11/20/2017  Medication Sig  .  alfuzosin (UROXATRAL) 10 MG 24 hr tablet Take 10 mg by mouth daily with breakfast.  . amLODipine-benazepril (LOTREL) 10-20 MG per capsule Take 1 capsule by mouth daily.  Marland Kitchen. atorvastatin (LIPITOR) 10 MG tablet Take 10 mg by mouth every evening.   . Blood Glucose Monitoring Suppl (ACCU-CHEK AVIVA) device Use as instructed  . clopidogrel (PLAVIX) 75 MG tablet TAKE ONE TABLET BY MOUTH ONCE DAILY  . dapagliflozin propanediol (FARXIGA) 10 MG TABS tablet Take 10 mg by mouth daily.  Marland Kitchen. DEXILANT 60 MG capsule   . glipiZIDE (GLUCOTROL) 5 MG tablet Take 5 mg by mouth 2 (two) times daily before a meal.  . glucose blood (ACCU-CHEK AVIVA) test strip Use to test blood glucose 2 times a day.  . meclizine (ANTIVERT) 25 MG tablet   . metFORMIN (GLUCOPHAGE) 500 MG tablet Take 1-2 tablets (500-1,000 mg total) by mouth See admin instructions. 2 tablets (1000 mg) in the morning, and 1 tablet (500 mg) every evening  . methocarbamol (ROBAXIN) 500 MG tablet Take 500 mg by mouth every 8 (eight) hours as needed for muscle spasms.  . metoprolol succinate (TOPROL-XL)  100 MG 24 hr tablet Take 1 tablet by mouth daily.   . vitamin B-12 (CYANOCOBALAMIN) 1000 MCG tablet Take 1,000 mcg by mouth daily.   No facility-administered encounter medications on file as of 11/20/2017.    ALLERGIES: Allergies  Allergen Reactions  . Bee Venom Anaphylaxis    EPIPEN required  . Contrast Media [Iodinated Diagnostic Agents] Nausea Only   VACCINATION STATUS:  There is no immunization history on file for this patient.  HPI  57 year old patient who is being seen in follow-up with repeat thyroid uptake and scan related to his symptomatic presentation and subclinical hyperthyroidism.   -He has no new complaints today.  His weight is stabilizing around 224 pounds .  He is on beta-blockers related to his hypertension, denies palpitations, tremors, heat intolerance.    -His repeat labs in January showed suppressed TSH of 0.006 associated with  high free T4 of 1.59.  He is not initiated on any antithyroid treatment.     The patient denies family history of thyroid dysfunction , or thyroid cancer.  he patient denies personal history of goiter.  - His repeat  thyroid uptake and scan is reported to be homogeneous uptake of 19% in 24 hours, lower than his prior study of 26%.  There is a questionable warm nodule in the left lobe of the thyroid.   -He has uncontrolled type 2 diabetes with A1c reportedly at 9%, on glipizide 10 mg p.o. twice daily and metformin 500 mg the morning and thousand milligrams in the evening.  Did not bring any meter no logs to review today.    Review of Systems Constitutional: + Steady weight , -fatigue, - subjective hyperthermia Eyes: no blurry vision, no xerophthalmia ENT: no sore throat, no nodules palpated in throat, no dysphagia/odynophagia, no hoarseness Cardiovascular: No chest pain, no palpitations, no shortness of breath.    Respiratory: no cough/SOB Gastrointestinal: no N/V/D/C Musculoskeletal: no muscle/joint aches Skin: no rashes Neurological:  -tremors,  - numbness/tingling/dizziness Psychiatric: no depression, +anxiety  Objective:    BP 132/86   Pulse 83   Ht 6' (1.829 m)   Wt 224 lb (101.6 kg)   BMI 30.38 kg/m   Wt Readings from Last 3 Encounters:  11/20/17 224 lb (101.6 kg)  11/05/17 223 lb (101.2 kg)  09/05/17 229 lb (103.9 kg)    Physical Exam Constitutional: overweight, not in acute distress.   Eyes: PERRLA, EOMI, no exophthalmos ENT: moist mucous membranes, no thyromegaly, no cervical lymphadenopathy Musculoskeletal: no deformities, strength intact in all 4 Skin: moist, warm, no rashes Neurological: - tremor with outstretched hands   Results for orders placed or performed in visit on 11/05/17  TSH  Result Value Ref Range   TSH 0.01 (A) 0.41 - 5.90   - Nuclear medicine study in February 2018 showed uniform uptake of 26% . 06/21/2016 TSH 0.175, 08/24/2016  TSH <  0.006. -Nuclear medicine study in March 2019   homogenous  uptake of 19%.  Questionable warm nodule in the left lobe of the thyroid   Assessment & Plan:   1.  Subclinical hyperthyroidism -His previsit labs are still consistent with subclinical hyperthyroidism, however patient has symptoms. -He is proven unreliable for follow-up, DC his appointment since February 2018. -I would proceed to repeat thyroid uptake and scan. -His workup confirms hyper thyroidism, will be considered for definitive treatment. -Return in 7-10 days for reevaluation. -The meantime, I advised him to be consistent taking his Toprol 100 mg XL which is helping for symptoms.  2.  Type 2 diabetes: Uncontrolled with recent A1c of 9%.  -  Suggestion is made for him to avoid simple carbohydrates  from his diet including Cakes, Sweet Desserts / Pastries, Ice Cream, Soda (diet and regular), Sweet Tea, Candies, Chips, Cookies, Store Bought Juices, Alcohol in Excess of  1-2 drinks a day, Artificial Sweeteners, and "Sugar-free" Products. This will help patient to have stable blood glucose profile and potentially avoid unintended weight gain. -I advised him to lower glipizide to 5 mg p.o. twice daily with meals and increase metformin to 1000 mg p.o. twice daily.  He will have repeat labs with A1c before his next visit.  - I advised patient to maintain close follow up with Ignatius Specking, MD for primary care needs.  Follow up plan: Return in about 3 months (around 02/20/2018) for follow up with pre-visit labs, meter, and logs.  Marquis Lunch, MD Phone: [phone]  Fax: [phone]  -  This note was partially dictated with voice recognition software. Similar sounding words can be transcribed inadequately or may not  be corrected upon review.  11/20/2017, 3:41 PM

## 2018-02-21 ENCOUNTER — Encounter: Payer: Self-pay | Admitting: "Endocrinology

## 2018-02-21 ENCOUNTER — Ambulatory Visit (INDEPENDENT_AMBULATORY_CARE_PROVIDER_SITE_OTHER): Payer: BC Managed Care – PPO | Admitting: "Endocrinology

## 2018-02-21 VITALS — BP 136/82 | HR 79 | Ht 72.0 in | Wt 214.0 lb

## 2018-02-21 DIAGNOSIS — E0590 Thyrotoxicosis, unspecified without thyrotoxic crisis or storm: Secondary | ICD-10-CM | POA: Diagnosis not present

## 2018-02-21 DIAGNOSIS — I10 Essential (primary) hypertension: Secondary | ICD-10-CM | POA: Diagnosis not present

## 2018-02-21 DIAGNOSIS — E1165 Type 2 diabetes mellitus with hyperglycemia: Secondary | ICD-10-CM | POA: Diagnosis not present

## 2018-02-21 NOTE — Patient Instructions (Signed)

## 2018-02-21 NOTE — Progress Notes (Signed)
Subjective:    Patient ID: Eric Glass, male    DOB: [birth year], PCP Ignatius Specking, MD.   Past Medical History:  Diagnosis Date  . Arthritis   . Chest pain   . CVA (cerebral infarction)   . Diabetes mellitus without complication (HCC)   . Headache   . Hypertension   . Neck pain   . Stroke [facility])    Past Surgical History:  Procedure Laterality Date  . CARDIAC CATHETERIZATION N/A 05/01/2016   Procedure: Left Heart Cath and Coronary Angiography;  Surgeon: Yates Decamp, MD;  Location: [facility] INVASIVE CV LAB;  Service: Cardiovascular;  Laterality: N/A;  . rotator cuff replacement Left    Social History   Socioeconomic History  . Marital status: Married    Spouse name: Not on file  . Number of children: Not on file  . Years of education: Not on file  . Highest education level: Not on file  Occupational History  . Not on file  Social Needs  . Financial resource strain: Not on file  . Food insecurity:    Worry: Not on file    Inability: Not on file  . Transportation needs:    Medical: Not on file    Non-medical: Not on file  Tobacco Use  . Smoking status: Former Smoker    Last attempt to quit: 02/09/2015    Years since quitting: 3.0  . Smokeless tobacco: Never Used  Substance and Sexual Activity  . Alcohol use: No    Alcohol/week: 0.0 oz  . Drug use: No  . Sexual activity: Not on file  Lifestyle  . Physical activity:    Days per week: Not on file    Minutes per session: Not on file  . Stress: Not on file  Relationships  . Social connections:    Talks on phone: Not on file    Gets together: Not on file    Attends religious service: Not on file    Active member of club or organization: Not on file    Attends meetings of clubs or organizations: Not on file    Relationship status: Not on file  Other Topics Concern  . Not on file  Social History Narrative   Drinks 1 cup of caffeine daily.   Outpatient Encounter Medications as of 02/21/2018  Medication Sig  .  alfuzosin (UROXATRAL) 10 MG 24 hr tablet Take 10 mg by mouth daily with breakfast.  . amLODipine-benazepril (LOTREL) 10-20 MG per capsule Take 1 capsule by mouth daily.  Marland Kitchen atorvastatin (LIPITOR) 10 MG tablet Take 10 mg by mouth every evening.   . Blood Glucose Monitoring Suppl (ACCU-CHEK AVIVA) device Use as instructed  . clopidogrel (PLAVIX) 75 MG tablet TAKE ONE TABLET BY MOUTH ONCE DAILY  . dapagliflozin propanediol (FARXIGA) 10 MG TABS tablet Take 10 mg by mouth daily.  Marland Kitchen DEXILANT 60 MG capsule   . glipiZIDE (GLUCOTROL) 5 MG tablet Take 5 mg by mouth 2 (two) times daily before a meal.  . glucose blood (ACCU-CHEK AVIVA) test strip Use to test blood glucose 2 times a day.  . meclizine (ANTIVERT) 25 MG tablet   . metFORMIN (GLUCOPHAGE) 500 MG tablet Take 1-2 tablets (500-1,000 mg total) by mouth See admin instructions. 2 tablets (1000 mg) in the morning, and 1 tablet (500 mg) every evening  . methocarbamol (ROBAXIN) 500 MG tablet Take 500 mg by mouth every 8 (eight) hours as needed for muscle spasms.  . metoprolol succinate (TOPROL-XL)  100 MG 24 hr tablet Take 1 tablet by mouth daily.   . vitamin B-12 (CYANOCOBALAMIN) 1000 MCG tablet Take 1,000 mcg by mouth daily.   No facility-administered encounter medications on file as of 02/21/2018.    ALLERGIES: Allergies  Allergen Reactions  . Bee Venom Anaphylaxis    EPIPEN required  . Contrast Media [Iodinated Diagnostic Agents] Nausea Only   VACCINATION STATUS:  There is no immunization history on file for this patient.  HPI  57 year old patient who is being seen in follow-up with repeat thyroid uptake and scan related to his symptomatic presentation and  hyperthyroidism.   -He has continued to be symptomatic including 10 pounds of unintentional weight loss since last visit. -He also complains of anxiety, and sleep disturbance. .-  He is on beta-blockers related to his hypertension, denies palpitations, tremors, heat intolerance.    -His  repeat labs in January showed suppressed TSH and elevated free T4.    -He is not currently on antithyroid treatment.     The patient denies family history of thyroid dysfunction , or thyroid cancer.  he patient denies personal history of goiter.  - His repeat  thyroid uptake and scan is reported to be homogeneous uptake of 19% in 24 hours, lower than his prior study of 26%.  There is a questionable warm nodule in the left lobe of the thyroid.   -He has improved his A1c to 7.3% from 9%, currently on glipizide 5 mg p.o. twice daily and metformin 500 mg p.o. twice daily.   -He did not bring any meter nor logs, reports that he is monitoring blood glucose twice a day, denies hypoglycemia.       Review of Systems Constitutional: +  Weight loss , +fatigue, -+subjective hyperthermia Eyes: no blurry vision, no xerophthalmia ENT: no sore throat, no nodules palpated in throat, no dysphagia/odynophagia, no hoarseness Cardiovascular: No chest pain, no palpitations, no shortness of breath.    Respiratory: no cough/SOB Gastrointestinal: no N/V/D/C Musculoskeletal: no muscle/joint aches Skin: no rashes Neurological:  -tremors,  - numbness/tingling/dizziness Psychiatric: no depression, +anxiety  Objective:    BP 136/82   Pulse 79   Ht 6' (1.829 m)   Wt 214 lb (97.1 kg)   BMI 29.02 kg/m   Wt Readings from Last 3 Encounters:  02/21/18 214 lb (97.1 kg)  11/20/17 224 lb (101.6 kg)  11/05/17 223 lb (101.2 kg)    Physical Exam Constitutional: overweight, not in acute distress.   Eyes: PERRLA, EOMI, no exophthalmos ENT: moist mucous membranes, no thyromegaly, no cervical lymphadenopathy Musculoskeletal: no deformities, strength intact in all 4 Skin: moist, warm, no rashes Neurological: - tremor with outstretched hands   Results for orders placed or performed in visit on 11/20/17  COMPLETE METABOLIC PANEL WITH GFR  Result Value Ref Range   Glucose, Bld 186 (H) 65 - 139 mg/dL   BUN 20 7 -  25 mg/dL   Creat 1.611.12 0.960.70 - 0.451.33 mg/dL   GFR, Est Non African American 73 > OR = 60 mL/min/1.2973m2   GFR, Est African American 85 > OR = 60 mL/min/1.4673m2   BUN/Creatinine Ratio NOT APPLICABLE 6 - 22 (calc)   Sodium 139 135 - 146 mmol/L   Potassium 3.7 3.5 - 5.3 mmol/L   Chloride 107 98 - 110 mmol/L   CO2 24 20 - 32 mmol/L   Calcium 9.9 8.6 - 10.3 mg/dL   Total Protein 6.9 6.1 - 8.1 g/dL   Albumin 4.5 3.6 - 5.1 g/dL  Globulin 2.4 1.9 - 3.7 g/dL (calc)   AG Ratio 1.9 1.0 - 2.5 (calc)   Total Bilirubin 0.3 0.2 - 1.2 mg/dL   Alkaline phosphatase (APISO) 74 40 - 115 U/L   AST 21 10 - 35 U/L   ALT 28 9 - 46 U/L  Hemoglobin A1c  Result Value Ref Range   Hgb A1c MFr Bld 7.3 (H) <5.7 % of total Hgb   Mean Plasma Glucose 163 (calc)   eAG (mmol/L) 9.0 (calc)  T4, free  Result Value Ref Range   Free T4 2.0 (H) 0.8 - 1.8 ng/dL  TSH  Result Value Ref Range   TSH 0.01 (L) 0.40 - 4.50 mIU/L   - Nuclear medicine study in February 2018 showed uniform uptake of 26% . 06/21/2016 TSH 0.175, 08/24/2016  TSH < 0.006. -Nuclear medicine study in March 2019   homogenous  uptake of 19%.  Questionable warm nodule in the left lobe of the thyroid   Assessment & Plan:   1.  Hyperthyroidism -His presentation now is consistent with hyperthyroidism from toxic nodule on the left lobe of the thyroid. -He is unreliable for follow-up.  He is approached for definitive treatment with radioactive iodine. -I discussed and prescribed I-131 therapy. -He is made aware of the fact that subsequent thyroid hormone replacement may be necessary. -This treatment will be scheduled to be administered as soon as possible. -In the meantime, I advised him to continue metoprolol 100 mg XL which was originally prescribed for blood pressure control.   2.  Type 2 diabetes: Returns with better A1c of 7.3% from 9%.    -I advised him to continue glipizide 5 mg p.o. twice daily with meals, and metformin 1000 mg p.o. twice daily  after breakfast and supper.   He is advised to continue monitoring blood glucose twice a day-daily before breakfast and at bedtime.     -  Suggestion is made for him to avoid simple carbohydrates  from his diet including Cakes, Sweet Desserts / Pastries, Ice Cream, Soda (diet and regular), Sweet Tea, Candies, Chips, Cookies, Store Bought Juices, Alcohol in Excess of  1-2 drinks a day, Artificial Sweeteners, and "Sugar-free" Products. This will help patient to have stable blood glucose profile and potentially avoid unintended weight gain.  - I advised patient to maintain close follow up with Ignatius Specking, MD for primary care needs.  Follow up plan: Return in about 3 months (around 05/24/2018) for follow up with pre-visit labs, follow up with labs after I131 therapy.  Marquis Lunch, MD Phone: [phone]  Fax: [phone]  -  This note was partially dictated with voice recognition software. Similar sounding words can be transcribed inadequately or may not  be corrected upon review.  02/21/2018, 7:25 PM

## 2018-03-13 ENCOUNTER — Encounter (HOSPITAL_COMMUNITY): Admission: RE | Admit: 2018-03-13 | Payer: BC Managed Care – PPO | Source: Ambulatory Visit

## 2018-03-14 ENCOUNTER — Ambulatory Visit
Admission: RE | Admit: 2018-03-14 | Discharge: 2018-03-14 | Disposition: A | Discharge status: Home / Self Care | Payer: BC Managed Care – PPO | Source: Ambulatory Visit | Attending: "Endocrinology | Admitting: "Endocrinology

## 2018-03-14 DIAGNOSIS — E0520 Thyrotoxicosis with toxic multinodular goiter without thyrotoxic crisis or storm: Secondary | ICD-10-CM | POA: Diagnosis present

## 2018-03-14 DIAGNOSIS — E0590 Thyrotoxicosis, unspecified without thyrotoxic crisis or storm: Secondary | ICD-10-CM

## 2018-03-14 MED ORDER — SODIUM IODIDE I 131 CAPSULE
38.8600 | Freq: Once | INTRAVENOUS | Status: AC
  Administered 2018-03-14: 38.86 via ORAL

## 2018-03-31 ENCOUNTER — Telehealth: Payer: Self-pay | Admitting: "Endocrinology

## 2018-03-31 ENCOUNTER — Other Ambulatory Visit: Payer: Self-pay | Admitting: "Endocrinology

## 2018-03-31 MED ORDER — PREDNISONE 5 MG PO TABS: 5.0000 mg | ORAL_TABLET | Freq: Every day | ORAL | 0 refills | Status: DC

## 2018-03-31 NOTE — Telephone Encounter (Signed)
Patient is aware of the recommendation 

## 2018-03-31 NOTE — Telephone Encounter (Signed)
I am sending rx for prednisone 5 mg daily for 7 days to [facility].

## 2018-03-31 NOTE — Telephone Encounter (Signed)
Eric Glass is stating that he had radiation on July 19 and for the last 2 weeks his throat has been sore and also stating that he has had a headache as well, please advise?

## 2018-04-10 ENCOUNTER — Encounter: Payer: Self-pay | Admitting: "Endocrinology

## 2018-04-10 ENCOUNTER — Ambulatory Visit (INDEPENDENT_AMBULATORY_CARE_PROVIDER_SITE_OTHER): Payer: BC Managed Care – PPO | Admitting: "Endocrinology

## 2018-04-10 VITALS — BP 130/83 | HR 100 | Ht 72.0 in | Wt 210.0 lb

## 2018-04-10 DIAGNOSIS — E069 Thyroiditis, unspecified: Secondary | ICD-10-CM | POA: Diagnosis not present

## 2018-04-10 MED ORDER — PREDNISONE 10 MG PO TABS: 10.0000 mg | ORAL_TABLET | Freq: Every day | ORAL | 0 refills | Status: DC

## 2018-04-10 NOTE — Progress Notes (Signed)
Endocrinology follow-up note   Subjective:    Patient ID: Eric Glass, male    DOB: 05-05-1961, PCP Ignatius Specking, MD.   Past Medical History:  Diagnosis Date  . Arthritis   . Chest pain   . CVA (cerebral infarction)   . Diabetes mellitus without complication (HCC)   . Headache   . Hypertension   . Neck pain   . Stroke [facility])    Past Surgical History:  Procedure Laterality Date  . CARDIAC CATHETERIZATION N/A 05/01/2016   Procedure: Left Heart Cath and Coronary Angiography;  Surgeon: Yates Decamp, MD;  Location: [facility] INVASIVE CV LAB;  Service: Cardiovascular;  Laterality: N/A;  . rotator cuff replacement Left    Social History   Socioeconomic History  . Marital status: Married    Spouse name: Not on file  . Number of children: Not on file  . Years of education: Not on file  . Highest education level: Not on file  Occupational History  . Not on file  Social Needs  . Financial resource strain: Not on file  . Food insecurity:    Worry: Not on file    Inability: Not on file  . Transportation needs:    Medical: Not on file    Non-medical: Not on file  Tobacco Use  . Smoking status: Former Smoker    Last attempt to quit: 02/09/2015    Years since quitting: 3.1  . Smokeless tobacco: Never Used  Substance and Sexual Activity  . Alcohol use: No    Alcohol/week: 0.0 standard drinks  . Drug use: No  . Sexual activity: Not on file  Lifestyle  . Physical activity:    Days per week: Not on file    Minutes per session: Not on file  . Stress: Not on file  Relationships  . Social connections:    Talks on phone: Not on file    Gets together: Not on file    Attends religious service: Not on file    Active member of club or organization: Not on file    Attends meetings of clubs or organizations: Not on file    Relationship status: Not on file  Other Topics Concern  . Not on file  Social History Narrative   Drinks 1 cup of caffeine daily.   Outpatient Encounter  Medications as of 04/10/2018  Medication Sig  . alfuzosin (UROXATRAL) 10 MG 24 hr tablet Take 10 mg by mouth daily with breakfast.  . amLODipine-benazepril (LOTREL) 10-20 MG per capsule Take 1 capsule by mouth daily.  Marland Kitchen atorvastatin (LIPITOR) 10 MG tablet Take 10 mg by mouth every evening.   . Blood Glucose Monitoring Suppl (ACCU-CHEK AVIVA) device Use as instructed  . clopidogrel (PLAVIX) 75 MG tablet TAKE ONE TABLET BY MOUTH ONCE DAILY  . dapagliflozin propanediol (FARXIGA) 10 MG TABS tablet Take 10 mg by mouth daily.  Marland Kitchen DEXILANT 60 MG capsule   . glipiZIDE (GLUCOTROL) 5 MG tablet Take 5 mg by mouth 2 (two) times daily before a meal.  . glucose blood (ACCU-CHEK AVIVA) test strip Use to test blood glucose 2 times a day.  . meclizine (ANTIVERT) 25 MG tablet   . metFORMIN (GLUCOPHAGE) 500 MG tablet Take 1-2 tablets (500-1,000 mg total) by mouth See admin instructions. 2 tablets (1000 mg) in the morning, and 1 tablet (500 mg) every evening  . methocarbamol (ROBAXIN) 500 MG tablet Take 500 mg by mouth every 8 (eight) hours as needed for muscle spasms.  Marland Kitchen  metoprolol succinate (TOPROL-XL) 100 MG 24 hr tablet Take 1 tablet by mouth daily.   . predniSONE (DELTASONE) 10 MG tablet Take 1 tablet (10 mg total) by mouth daily with breakfast.  . vitamin B-12 (CYANOCOBALAMIN) 1000 MCG tablet Take 1,000 mcg by mouth daily.  . [DISCONTINUED] predniSONE (DELTASONE) 5 MG tablet Take 1 tablet (5 mg total) by mouth daily with breakfast. (Patient not taking: Reported on 04/10/2018)   No facility-administered encounter medications on file as of 04/10/2018.    ALLERGIES: Allergies  Allergen Reactions  . Bee Venom Anaphylaxis    EPIPEN required  . Contrast Media [Iodinated Diagnostic Agents] Nausea Only   VACCINATION STATUS:  There is no immunization history on file for this patient.  HPI  57 year old patient who underwent I-131 therapy for hyperthyroidism on March 14, 2018.  -Few days after exposure to I-131  patient started to have neck pain which has been persistent, worsened on swallowing, movement and touching on the thyroid area.  -He was given low-dose prednisone 5 mg p.o. daily for 7 days with minimal relief.  -He returns with the same complaints, no fever.  He denies dysphagia, shortness of breath, nor voice change.   -He continues to lose weight, still complains of palpitations and some tremors.  -  He mains  on beta-blockers related to his hypertension, denies palpitations, tremors, heat intolerance.   -He did not have thyroid function tests after he is I-131 therapy. -He is not currently on antithyroid treatment.     The patient denies family history of thyroid dysfunction , or thyroid cancer.  The patient denies personal history of goiter.  -2 weeks ago, he states he was initiated on [place] by his PMD for diabetes along with his metformin and glipizide. -His recent labs showed A1c of 7.3%, reports even better A1c of 6.8% on his PMD office.   -He did not bring any meter nor logs, reports that he is monitoring blood glucose twice a day, denies hypoglycemia.       Review of Systems Constitutional: +  Weight loss , +fatigue, -+subjective hyperthermia Eyes: no blurry vision, no xerophthalmia ENT:  + Neck pain over the thyroid, no sore throat, no nodules palpated in throat, no dysphagia/odynophagia, no hoarseness Cardiovascular: No chest pain, no palpitations, no shortness of breath.    Musculoskeletal: no muscle/joint aches Skin: no rashes Neurological:  -tremors,  - numbness/tingling/dizziness Psychiatric: no depression, +anxiety  Objective:    BP 130/83   Pulse 100   Ht 6' (1.829 m)   Wt 210 lb (95.3 kg)   BMI 28.48 kg/m   Wt Readings from Last 3 Encounters:  04/10/18 210 lb (95.3 kg)  02/21/18 214 lb (97.1 kg)  11/20/17 224 lb (101.6 kg)    Physical Exam Constitutional: overweight, not in acute distress.   Eyes: PERRLA, EOMI, no exophthalmos ENT: moist mucous  membranes, no thyromegaly , + moderately tender on the thyroid, no cervical lymphadenopathy, + no evidence of tonsillopharyngitis. Musculoskeletal: no deformities, strength intact in all 4 Skin: moist, warm, no rashes Neurological: + Mild tremor with outstretched hands   Results for orders placed or performed in visit on 11/20/17  COMPLETE METABOLIC PANEL WITH GFR  Result Value Ref Range   Glucose, Bld 186 (H) 65 - 139 mg/dL   BUN 20 7 - 25 mg/dL   Creat 1.611.12 0.960.70 - 0.451.33 mg/dL   GFR, Est Non African American 73 > OR = 60 mL/min/1.4273m2   GFR, Est African American 85 > OR =  60 mL/min/1.1173m2   BUN/Creatinine Ratio NOT APPLICABLE 6 - 22 (calc)   Sodium 139 135 - 146 mmol/L   Potassium 3.7 3.5 - 5.3 mmol/L   Chloride 107 98 - 110 mmol/L   CO2 24 20 - 32 mmol/L   Calcium 9.9 8.6 - 10.3 mg/dL   Total Protein 6.9 6.1 - 8.1 g/dL   Albumin 4.5 3.6 - 5.1 g/dL   Globulin 2.4 1.9 - 3.7 g/dL (calc)   AG Ratio 1.9 1.0 - 2.5 (calc)   Total Bilirubin 0.3 0.2 - 1.2 mg/dL   Alkaline phosphatase (APISO) 74 40 - 115 U/L   AST 21 10 - 35 U/L   ALT 28 9 - 46 U/L  Hemoglobin A1c  Result Value Ref Range   Hgb A1c MFr Bld 7.3 (H) <5.7 % of total Hgb   Mean Plasma Glucose 163 (calc)   eAG (mmol/L) 9.0 (calc)  T4, free  Result Value Ref Range   Free T4 2.0 (H) 0.8 - 1.8 ng/dL  TSH  Result Value Ref Range   TSH 0.01 (L) 0.40 - 4.50 mIU/L   - Nuclear medicine study in February 2018 showed uniform uptake of 26% . 06/21/2016 TSH 0.175, 08/24/2016  TSH < 0.006. -Nuclear medicine study in March 2019   homogenous  uptake of 19%.  Questionable warm nodule in the left lobe of the thyroid  -Per oral administration of 38.86 mCi I-131 sodium iodide for the treatment of hyperthyroidism on March 14, 2018.   Assessment & Plan:   1.  Thyroiditis 2. Hyperthyroidism  -He is status post I-131 exposure for treatment of hyperthyroidism on March 14, 2018. -His presentation and exam is consistent with thyroiditis.  I  discussed and prescribed prednisone 10 mg p.o. daily for 15 days along with his beta-blockers which is currently metoprolol 100 mg XL once a day.  -He will keep his appointment for repeat thyroid function tests and office visit in 4 to 5 weeks time.   2.  Type 2 diabetes: Following up with his PMD. -His recent A1c was 7.3% improving from 9%. -I advised him to continue glipizide 5 mg p.o. twice daily with meals, and metformin 1000 mg p.o. twice daily after breakfast and supper.  -I advised him to discontinue [place]xiga which was started by his PMD.  He is advised to continue monitoring blood glucose twice a day-daily before breakfast and at bedtime.   -  Suggestion is made for him to avoid simple carbohydrates  from his diet including Cakes, Sweet Desserts / Pastries, Ice Cream, Soda (diet and regular), Sweet Tea, Candies, Chips, Cookies, Store Bought Juices, Alcohol in Excess of  1-2 drinks a day, Artificial Sweeteners, and "Sugar-free" Products. This will help patient to have stable blood glucose profile and potentially avoid unintended weight gain.   - I advised patient to maintain close follow up with Ignatius SpeckingVyas, Dhruv B, MD for primary care needs.  Follow up plan: Return for Keep Regular Appointment with Pre-visit Labs.  Marquis LunchGebre Nida, MD Phone: [phone]  Fax: [phone]  -  This note was partially dictated with voice recognition software. Similar sounding words can be transcribed inadequately or may not  be corrected upon review.  04/10/2018, 1:36 PM

## 2018-05-20 LAB — MICROALBUMIN / CREATININE URINE RATIO
CREATININE, URINE: 126 mg/dL (ref 20–320)
MICROALB UR: 24.1 mg/dL
MICROALB/CREAT RATIO: 191 ug/mg{creat} — AB (ref <30)

## 2018-05-20 LAB — HEMOGLOBIN A1C
EAG (MMOL/L): 12 (calc)
Hgb A1c MFr Bld: 9.2 % of total Hgb — ABNORMAL HIGH (ref <5.7)
MEAN PLASMA GLUCOSE: 217 (calc)

## 2018-05-20 LAB — T4, FREE: FREE T4: 1.8 ng/dL (ref 0.8–1.8)

## 2018-05-20 LAB — VITAMIN B12: Vitamin B-12: 307 pg/mL (ref 200–1100)

## 2018-05-20 LAB — TSH: TSH: 0.01 mIU/L — ABNORMAL LOW (ref 0.40–4.50)

## 2018-05-26 ENCOUNTER — Ambulatory Visit (INDEPENDENT_AMBULATORY_CARE_PROVIDER_SITE_OTHER): Payer: BC Managed Care – PPO | Admitting: "Endocrinology

## 2018-05-26 ENCOUNTER — Encounter: Payer: Self-pay | Admitting: "Endocrinology

## 2018-05-26 VITALS — BP 132/84 | HR 82 | Ht 72.0 in | Wt 216.0 lb

## 2018-05-26 DIAGNOSIS — I10 Essential (primary) hypertension: Secondary | ICD-10-CM

## 2018-05-26 DIAGNOSIS — E0590 Thyrotoxicosis, unspecified without thyrotoxic crisis or storm: Secondary | ICD-10-CM

## 2018-05-26 DIAGNOSIS — E1165 Type 2 diabetes mellitus with hyperglycemia: Secondary | ICD-10-CM

## 2018-05-26 MED ORDER — METFORMIN HCL 1000 MG PO TABS: 1000.0000 mg | ORAL_TABLET | ORAL | 3 refills | Status: DC

## 2018-05-26 NOTE — Patient Instructions (Signed)

## 2018-05-26 NOTE — Progress Notes (Signed)
Endocrinology follow-up note  Subjective:    Patient ID: Eric Glass, male    DOB: 09/26/1960, PCP Ignatius Specking, MD.   Past Medical History:  Diagnosis Date  . Arthritis   . Chest pain   . CVA (cerebral infarction)   . Diabetes mellitus without complication (HCC)   . Headache   . Hypertension   . Neck pain   . Stroke [facility])    Past Surgical History:  Procedure Laterality Date  . CARDIAC CATHETERIZATION N/A 05/01/2016   Procedure: Left Heart Cath and Coronary Angiography;  Surgeon: Yates Decamp, MD;  Location: [facility] INVASIVE CV LAB;  Service: Cardiovascular;  Laterality: N/A;  . rotator cuff replacement Left    Social History   Socioeconomic History  . Marital status: Married    Spouse name: Not on file  . Number of children: Not on file  . Years of education: Not on file  . Highest education level: Not on file  Occupational History  . Not on file  Social Needs  . Financial resource strain: Not on file  . Food insecurity:    Worry: Not on file    Inability: Not on file  . Transportation needs:    Medical: Not on file    Non-medical: Not on file  Tobacco Use  . Smoking status: Former Smoker    Last attempt to quit: 02/09/2015    Years since quitting: 3.2  . Smokeless tobacco: Never Used  Substance and Sexual Activity  . Alcohol use: No    Alcohol/week: 0.0 standard drinks  . Drug use: No  . Sexual activity: Not on file  Lifestyle  . Physical activity:    Days per week: Not on file    Minutes per session: Not on file  . Stress: Not on file  Relationships  . Social connections:    Talks on phone: Not on file    Gets together: Not on file    Attends religious service: Not on file    Active member of club or organization: Not on file    Attends meetings of clubs or organizations: Not on file    Relationship status: Not on file  Other Topics Concern  . Not on file  Social History Narrative   Drinks 1 cup of caffeine daily.   Outpatient Encounter  Medications as of 05/26/2018  Medication Sig  . alfuzosin (UROXATRAL) 10 MG 24 hr tablet Take 10 mg by mouth daily with breakfast.  . amLODipine-benazepril (LOTREL) 10-20 MG per capsule Take 1 capsule by mouth daily.  Marland Kitchen atorvastatin (LIPITOR) 10 MG tablet Take 10 mg by mouth every evening.   . Blood Glucose Monitoring Suppl (ACCU-CHEK AVIVA) device Use as instructed  . clopidogrel (PLAVIX) 75 MG tablet TAKE ONE TABLET BY MOUTH ONCE DAILY  . DEXILANT 60 MG capsule   . glipiZIDE (GLUCOTROL) 5 MG tablet Take 10 mg by mouth 2 (two) times daily before a meal.  . glucose blood (ACCU-CHEK AVIVA) test strip Use to test blood glucose 2 times a day.  . meclizine (ANTIVERT) 25 MG tablet   . metFORMIN (GLUCOPHAGE) 1000 MG tablet Take 1 tablet (1,000 mg total) by mouth See admin instructions. 2 tablets (1000 mg) in the morning, and 1 tablet (500 mg) every evening  . methocarbamol (ROBAXIN) 500 MG tablet Take 500 mg by mouth every 8 (eight) hours as needed for muscle spasms.  . metoprolol succinate (TOPROL-XL) 100 MG 24 hr tablet Take 1 tablet by mouth  daily.   . vitamin B-12 (CYANOCOBALAMIN) 1000 MCG tablet Take 1,000 mcg by mouth daily.  . [DISCONTINUED] dapagliflozin propanediol (FARXIGA) 10 MG TABS tablet Take 10 mg by mouth daily.  . [DISCONTINUED] metFORMIN (GLUCOPHAGE) 500 MG tablet Take 1-2 tablets (500-1,000 mg total) by mouth See admin instructions. 2 tablets (1000 mg) in the morning, and 1 tablet (500 mg) every evening  . [DISCONTINUED] predniSONE (DELTASONE) 10 MG tablet Take 1 tablet (10 mg total) by mouth daily with breakfast.   No facility-administered encounter medications on file as of 05/26/2018.    ALLERGIES: Allergies  Allergen Reactions  . Bee Venom Anaphylaxis    EPIPEN required  . Contrast Media [Iodinated Diagnostic Agents] Nausea Only   VACCINATION STATUS:  There is no immunization history on file for this patient.  HPI  57 year old male patient  who is being seen in  follow-up status post I-131 therapy on March 14, 2018 for hyperthyroidism.   -He was seen in the interim for thyroiditis which required brief course of treatment with steroids on top of beta-blockers symptomatic thyrotoxicosis.  -He has gained 6 pounds since last visit.  He feels better in terms of his previous symptoms including anxiety, sleep R, palpitations, tremors.   -He has currently uncontrolled type 2 diabetes with A1c of 9.2% increasing from 7.3%. -He admits to dietary discretion including consumption of sweetened beverages.  He is currently on glipizide 5 mg p.o. twice daily along with metformin 1000 mg p.o. twice daily. -He did not bring any meter nor logs, reports that he is monitoring blood glucose twice a day, denies hypoglycemia.     Review of Systems Constitutional: +  Weight gain, +fatigue, +subjective hyperthermia Eyes: no blurry vision, no xerophthalmia ENT: no sore throat, no nodules palpated in throat, no dysphagia/odynophagia, no hoarseness Cardiovascular: No chest pain, no palpitations, no shortness of breath.    Respiratory: no cough/SOB Gastrointestinal: no N/V/D/C Musculoskeletal: no muscle/joint aches Skin: no rashes Neurological:  -tremors,  - numbness/tingling/dizziness Psychiatric: no depression, +anxiety  Objective:    BP 132/84   Pulse 82   Ht 6' (1.829 m)   Wt 216 lb (98 kg)   BMI 29.29 kg/m   Wt Readings from Last 3 Encounters:  05/26/18 216 lb (98 kg)  04/10/18 210 lb (95.3 kg)  02/21/18 214 lb (97.1 kg)    Physical Exam Constitutional: overweight, not in acute distress.   Eyes: PERRLA, EOMI, no exophthalmos ENT: Glass mucous membranes, no thyromegaly, no cervical lymphadenopathy Musculoskeletal: no deformities, strength intact in all 4 Skin: Glass, warm, no rashes Neurological: - tremor with outstretched hands   Results for orders placed or performed in visit on 02/21/18  Hemoglobin A1c  Result Value Ref Range   Hgb A1c MFr Bld 9.2 (H)  <5.7 % of total Hgb   Mean Plasma Glucose 217 (calc)   eAG (mmol/L) 12.0 (calc)  T4, free  Result Value Ref Range   Free T4 1.8 0.8 - 1.8 ng/dL  TSH  Result Value Ref Range   TSH 0.01 (L) 0.40 - 4.50 mIU/L  Microalbumin / creatinine urine ratio  Result Value Ref Range   Creatinine, Urine 126 20 - 320 mg/dL   Microalb, Ur 78.2 mg/dL   Microalb Creat Ratio 191 (H) <30 mcg/mg creat  Vitamin B12  Result Value Ref Range   Vitamin B-12 307 200 - 1,100 pg/mL   - Nuclear medicine study in February 2018 showed uniform uptake of 26% . 06/21/2016 TSH 0.175, 08/24/2016  TSH <  0.006. -Nuclear medicine study in March 2019   homogenous  uptake of 19%.  Questionable warm nodule in the left lobe of the thyroid  Status post I-131 thyroid ablation on March 14, 2018.   Assessment & Plan:   1.  Hyperthyroidism -He is status post I-131 thyroid ablation on March 14, 2018. -His previsit labs are consistent with treatment effect however not hypothyroid yet.  He will not be initiated on thyroid hormone replacement.   2.  Type 2 diabetes:   -He is losing control with A1c of 9.2% increasing from 7.3%. -He is hesitant to go on injectable therapy. -I advised him to increase glipizide to 10 mg p.o. twice daily, continue metformin 1000 mg p.o. twice daily after breakfast and supper.  He is advised to continue monitoring blood glucose twice a day-daily before breakfast and at bedtime.    He will be reapproached for basal insulin if he returns with A1c above 9%. -  Suggestion is made for him to avoid simple carbohydrates  from his diet including Cakes, Sweet Desserts / Pastries, Ice Cream, Soda (diet and regular), Sweet Tea, Candies, Chips, Cookies, Store Bought Juices, Alcohol in Excess of  1-2 drinks a day, Artificial Sweeteners, and "Sugar-free" Products. This will help patient to have stable blood glucose profile and potentially avoid unintended weight gain.   - I advised patient to maintain close follow  up with Ignatius Specking, MD for primary care needs.  Follow up plan: Return in about 3 months (around 08/25/2018) for Follow up with Pre-visit Labs.  Marquis Lunch, MD Phone: [phone]  Fax: [phone]  -  This note was partially dictated with voice recognition software. Similar sounding words can be transcribed inadequately or may not  be corrected upon review.  05/26/2018, 5:05 PM

## 2018-05-27 LAB — COMPLETE METABOLIC PANEL WITH GFR
AG Ratio: 1.9 (calc) (ref 1.0–2.5)
ALBUMIN MSPROF: 4.5 g/dL (ref 3.6–5.1)
ALT: 28 U/L (ref 9–46)
AST: 21 U/L (ref 10–35)
Alkaline phosphatase (APISO): 74 U/L (ref 40–115)
BUN: 20 mg/dL (ref 7–25)
CO2: 24 mmol/L (ref 20–32)
CREATININE: 1.12 mg/dL (ref 0.70–1.33)
Calcium: 9.9 mg/dL (ref 8.6–10.3)
Chloride: 107 mmol/L (ref 98–110)
GFR, EST AFRICAN AMERICAN: 85 mL/min/{1.73_m2} (ref >=60)
GFR, Est Non African American: 73 mL/min/{1.73_m2} (ref >=60)
GLUCOSE: 186 mg/dL — AB (ref 65–139)
Globulin: 2.4 g/dL (calc) (ref 1.9–3.7)
Potassium: 3.7 mmol/L (ref 3.5–5.3)
Sodium: 139 mmol/L (ref 135–146)
Total Bilirubin: 0.3 mg/dL (ref 0.2–1.2)
Total Protein: 6.9 g/dL (ref 6.1–8.1)

## 2018-05-27 LAB — HEMOGLOBIN A1C
HEMOGLOBIN A1C: 7.3 %{Hb} — AB (ref <5.7)
Mean Plasma Glucose: 163 (calc)
eAG (mmol/L): 9 (calc)

## 2018-05-27 LAB — TSH: TSH: 0.01 m[IU]/L — AB (ref 0.40–4.50)

## 2018-05-27 LAB — T4, FREE: FREE T4: 2 ng/dL — AB (ref 0.8–1.8)

## 2018-08-19 LAB — COMPLETE METABOLIC PANEL WITH GFR
AG Ratio: 1.7 (calc) (ref 1.0–2.5)
ALBUMIN MSPROF: 4.7 g/dL (ref 3.6–5.1)
ALKALINE PHOSPHATASE (APISO): 101 U/L (ref 40–115)
ALT: 31 U/L (ref 9–46)
AST: 39 U/L — AB (ref 10–35)
BUN: 12 mg/dL (ref 7–25)
CALCIUM: 9.8 mg/dL (ref 8.6–10.3)
CO2: 31 mmol/L (ref 20–32)
Chloride: 104 mmol/L (ref 98–110)
Creat: 1.2 mg/dL (ref 0.70–1.33)
GFR, EST NON AFRICAN AMERICAN: 67 mL/min/{1.73_m2} (ref >=60)
GFR, Est African American: 78 mL/min/{1.73_m2} (ref >=60)
GLOBULIN: 2.8 g/dL (ref 1.9–3.7)
GLUCOSE: 109 mg/dL — AB (ref 65–99)
Potassium: 4 mmol/L (ref 3.5–5.3)
Sodium: 142 mmol/L (ref 135–146)
Total Bilirubin: 0.5 mg/dL (ref 0.2–1.2)
Total Protein: 7.5 g/dL (ref 6.1–8.1)

## 2018-08-19 LAB — T4, FREE: Free T4: 0.4 ng/dL — ABNORMAL LOW (ref 0.8–1.8)

## 2018-08-19 LAB — HEMOGLOBIN A1C
EAG (MMOL/L): 10.3 (calc)
HEMOGLOBIN A1C: 8.1 %{Hb} — AB (ref <5.7)
Mean Plasma Glucose: 186 (calc)

## 2018-08-19 LAB — TSH: TSH: 48.96 m[IU]/L — AB (ref 0.40–4.50)

## 2018-08-26 ENCOUNTER — Encounter: Payer: Self-pay | Admitting: "Endocrinology

## 2018-08-26 ENCOUNTER — Ambulatory Visit (INDEPENDENT_AMBULATORY_CARE_PROVIDER_SITE_OTHER): Payer: BC Managed Care – PPO | Admitting: "Endocrinology

## 2018-08-26 VITALS — BP 136/90 | HR 68 | Ht 72.0 in | Wt 233.0 lb

## 2018-08-26 DIAGNOSIS — E890 Postprocedural hypothyroidism: Secondary | ICD-10-CM | POA: Diagnosis not present

## 2018-08-26 DIAGNOSIS — E1165 Type 2 diabetes mellitus with hyperglycemia: Secondary | ICD-10-CM | POA: Diagnosis not present

## 2018-08-26 MED ORDER — GLIPIZIDE 5 MG PO TABS: 5.0000 mg | ORAL_TABLET | Freq: Two times a day (BID) | ORAL | 2 refills | Status: AC

## 2018-08-26 MED ORDER — LEVOTHYROXINE SODIUM 75 MCG PO TABS: 75.0000 ug | ORAL_TABLET | Freq: Every day | ORAL | 3 refills | Status: DC

## 2018-08-26 NOTE — Progress Notes (Signed)
Endocrinology follow-up note  Subjective:    Patient ID: Eric Glass, male    DOB: Dec 15, 1960, PCP Eric Specking, MD.   Past Medical History:  Diagnosis Date  . Arthritis   . Chest pain   . CVA (cerebral infarction)   . Diabetes mellitus without complication (HCC)   . Headache   . Hypertension   . Neck pain   . Stroke [facility])    Past Surgical History:  Procedure Laterality Date  . CARDIAC CATHETERIZATION N/A 05/01/2016   Procedure: Left Heart Cath and Coronary Angiography;  Surgeon: Yates Decamp, MD;  Location: [facility] INVASIVE CV LAB;  Service: Cardiovascular;  Laterality: N/A;  . rotator cuff replacement Left    Social History   Socioeconomic History  . Marital status: Married    Spouse name: Not on file  . Number of children: Not on file  . Years of education: Not on file  . Highest education level: Not on file  Occupational History  . Not on file  Social Needs  . Financial resource strain: Not on file  . Food insecurity:    Worry: Not on file    Inability: Not on file  . Transportation needs:    Medical: Not on file    Non-medical: Not on file  Tobacco Use  . Smoking status: Former Smoker    Last attempt to quit: 02/09/2015    Years since quitting: 3.5  . Smokeless tobacco: Never Used  Substance and Sexual Activity  . Alcohol use: No    Alcohol/week: 0.0 standard drinks  . Drug use: No  . Sexual activity: Not on file  Lifestyle  . Physical activity:    Days per week: Not on file    Minutes per session: Not on file  . Stress: Not on file  Relationships  . Social connections:    Talks on phone: Not on file    Gets together: Not on file    Attends religious service: Not on file    Active member of club or organization: Not on file    Attends meetings of clubs or organizations: Not on file    Relationship status: Not on file  Other Topics Concern  . Not on file  Social History Narrative   Drinks 1 cup of caffeine daily.   Outpatient Encounter  Medications as of 08/26/2018  Medication Sig  . Semaglutide,0.25 or 0.5MG /DOS, (OZEMPIC, 0.25 OR 0.5 MG/DOSE,) 2 MG/1.5ML SOPN Inject into the skin once a week.  Marland Kitchen alfuzosin (UROXATRAL) 10 MG 24 hr tablet Take 10 mg by mouth daily with breakfast.  . amLODipine-benazepril (LOTREL) 10-20 MG per capsule Take 1 capsule by mouth daily.  Marland Kitchen atorvastatin (LIPITOR) 10 MG tablet Take 10 mg by mouth every evening.   . Blood Glucose Monitoring Suppl (ACCU-CHEK AVIVA) device Use as instructed  . clopidogrel (PLAVIX) 75 MG tablet TAKE ONE TABLET BY MOUTH ONCE DAILY  . DEXILANT 60 MG capsule   . glipiZIDE (GLUCOTROL) 5 MG tablet Take 1 tablet (5 mg total) by mouth 2 (two) times daily before a meal.  . glucose blood (ACCU-CHEK AVIVA) test strip Use to test blood glucose 2 times a day.  . levothyroxine (SYNTHROID, LEVOTHROID) 75 MCG tablet Take 1 tablet (75 mcg total) by mouth daily before breakfast.  . meclizine (ANTIVERT) 25 MG tablet   . metFORMIN (GLUCOPHAGE) 1000 MG tablet Take 1 tablet (1,000 mg total) by mouth See admin instructions. 2 tablets (1000 mg) in the morning, and  1 tablet (500 mg) every evening  . methocarbamol (ROBAXIN) 500 MG tablet Take 500 mg by mouth every 8 (eight) hours as needed for muscle spasms.  . metoprolol succinate (TOPROL-XL) 100 MG 24 hr tablet Take 1 tablet by mouth daily.   . vitamin Glass-12 (CYANOCOBALAMIN) 1000 MCG tablet Take 1,000 mcg by mouth daily.  . [DISCONTINUED] glipiZIDE (GLUCOTROL) 5 MG tablet Take 10 mg by mouth 2 (two) times daily before a meal.   No facility-administered encounter medications on file as of 08/26/2018.    ALLERGIES: Allergies  Allergen Reactions  . Bee Venom Anaphylaxis    EPIPEN required  . Contrast Media [Iodinated Diagnostic Agents] Nausea Only   VACCINATION STATUS:  There is no immunization history on file for this patient.  HPI  57 year old male patient returning for follow-up.  He is status post radioactive iodine thyroid ablation  for hyperthyroidism.  He received his treatment on March 14, 2018. -He reports subsequent improvement in his previous symptoms of palpitations, sweating/heat intolerance, tremors, anxiety.  -He has gained 15 pounds since last visit.   -His previsit labs show A1c of 8.1% improving from 9.2%.  He was initiated on Ozempic 0.5 mg weekly in the interim by his PMD.  He is also on metformin 1000 mg p.o. twice daily and glipizide 10 mg p.o. twice daily.  -He brought in a meter which shows average blood glucose of 116 over the last 7 days, monitoring only rarely and randomly.    Review of Systems Constitutional: +  Weight gain, +fatigue, +subjective hyperthermia Eyes: no blurry vision, no xerophthalmia ENT: no sore throat, no nodules palpated in throat, no dysphagia/odynophagia, no hoarseness Cardiovascular: No chest pain, no palpitations, no shortness of breath.    Respiratory: + Intermittent shortness of breath with exertion. Gastrointestinal: no N/V/D/C Musculoskeletal: no muscle/joint aches Skin: no rashes Neurological:  -tremors,  - numbness/tingling/dizziness Psychiatric: no depression, +anxiety  Objective:    BP 136/90   Pulse 68   Ht 6' (1.829 m)   Wt 233 lb (105.7 kg)   BMI 31.60 kg/m   Wt Readings from Last 3 Encounters:  08/26/18 233 lb (105.7 kg)  05/26/18 216 lb (98 kg)  04/10/18 210 lb (95.3 kg)    Physical Exam Constitutional:  + obese, not in acute distress.   Eyes: PERRLA, EOMI, no exophthalmos ENT: moist mucous membranes, no thyromegaly, no cervical lymphadenopathy Musculoskeletal: no deformities, strength intact in all 4 Skin: moist, warm, no rashes Neurological: - tremor with outstretched hands   Results for orders placed or performed in visit on 05/26/18  T4, free  Result Value Ref Range   Free T4 0.4 (L) 0.8 - 1.8 ng/dL  TSH  Result Value Ref Range   TSH 48.96 (H) 0.40 - 4.50 mIU/L  Hemoglobin A1c  Result Value Ref Range   Hgb A1c MFr Bld 8.1 (H) <5.7  % of total Hgb   Mean Plasma Glucose 186 (calc)   eAG (mmol/L) 10.3 (calc)  COMPLETE METABOLIC PANEL WITH GFR  Result Value Ref Range   Glucose, Bld 109 (H) 65 - 99 mg/dL   BUN 12 7 - 25 mg/dL   Creat 1.611.20 0.960.70 - 0.451.33 mg/dL   GFR, Est Non African American 67 > OR = 60 mL/min/1.10673m2   GFR, Est African American 78 > OR = 60 mL/min/1.2273m2   BUN/Creatinine Ratio NOT APPLICABLE 6 - 22 (calc)   Sodium 142 135 - 146 mmol/L   Potassium 4.0 3.5 - 5.3 mmol/L  Chloride 104 98 - 110 mmol/L   CO2 31 20 - 32 mmol/L   Calcium 9.8 8.6 - 10.3 mg/dL   Total Protein 7.5 6.1 - 8.1 g/dL   Albumin 4.7 3.6 - 5.1 g/dL   Globulin 2.8 1.9 - 3.7 g/dL (calc)   AG Ratio 1.7 1.0 - 2.5 (calc)   Total Bilirubin 0.5 0.2 - 1.2 mg/dL   Alkaline phosphatase (APISO) 101 40 - 115 U/L   AST 39 (H) 10 - 35 U/L   ALT 31 9 - 46 U/L   - Nuclear medicine study in February 2018 showed uniform uptake of 26% . 06/21/2016 TSH 0.175, 08/24/2016  TSH < 0.006. -Nuclear medicine study in March 2019   homogenous  uptake of 19%.  Questionable warm nodule in the left lobe of the thyroid  Status post I-131 thyroid ablation on March 14, 2018.   Assessment & Plan:   1.  Hyperthyroidism -He is status post I-131 thyroid ablation on March 14, 2018. -His previsit labs are consistent with treatment effect and RAI induced hypothyroidism.   -I discussed and initiated levothyroxine 75 mcg p.o. every morning.     - We discussed about correct intake of levothyroxine, at fasting, with water, separated by at least 30 minutes from breakfast, and separated by more than 4 hours from calcium, iron, multivitamins, acid reflux medications (PPIs). -Patient is made aware of the fact that thyroid hormone replacement is needed for life, dose to be adjusted by periodic monitoring of thyroid function tests.     2.  Type 2 diabetes:   -His A1c is 8.1% improving from 9.2%.   -He has accepted Ozempic injection by his PMD in the interim.  I advised him to  continue 0.5 mg of Ozempic weekly, continue metformin 1000 mg p.o. twice daily, lower glipizide to 5 mg p.o. twice daily with breakfast and supper.  -He has previously declined an offer for basal insulin.  - Time spent with the patient: 25 min, of which >50% was spent in reviewing his blood glucose logs , discussing his hypo- and hyper-glycemic episodes, reviewing his current and  previous labs and insulin doses and developing a plan to avoid hypo- and hyper-glycemia. Please refer to Patient Instructions for Blood Glucose Monitoring and Insulin/Medications Dosing Guide"  in media tab for additional information. Eric MontanaJerry A Glass participated in the discussions, expressed understanding, and voiced agreement with the above plans.  All questions were answered to his satisfaction. he is encouraged to contact clinic should he have any questions or concerns prior to his return visit.   - I advised patient to maintain close follow up with Eric SpeckingVyas, Eric B, MD for primary care needs.  Follow up plan: No follow-ups on file.  Eric LunchGebre Foster Sonnier, MD Phone: [phone]  Fax: [phone]  -  This note was partially dictated with voice recognition software. Similar sounding words can be transcribed inadequately or may not  be corrected upon review.  08/26/2018, 10:28 AM

## 2018-08-26 NOTE — Patient Instructions (Signed)

## 2018-11-19 LAB — COMPLETE METABOLIC PANEL WITH GFR
AG RATIO: 1.9 (calc) (ref 1.0–2.5)
ALKALINE PHOSPHATASE (APISO): 78 U/L (ref 35–144)
ALT: 21 U/L (ref 9–46)
AST: 19 U/L (ref 10–35)
Albumin: 4.5 g/dL (ref 3.6–5.1)
BILIRUBIN TOTAL: 0.3 mg/dL (ref 0.2–1.2)
BUN: 16 mg/dL (ref 7–25)
CHLORIDE: 106 mmol/L (ref 98–110)
CO2: 28 mmol/L (ref 20–32)
Calcium: 9.4 mg/dL (ref 8.6–10.3)
Creat: 1.2 mg/dL (ref 0.70–1.33)
GFR, Est African American: 78 mL/min/{1.73_m2} (ref >=60)
GFR, Est Non African American: 67 mL/min/{1.73_m2} (ref >=60)
GLOBULIN: 2.4 g/dL (ref 1.9–3.7)
Glucose, Bld: 109 mg/dL (ref 65–139)
POTASSIUM: 3.7 mmol/L (ref 3.5–5.3)
Sodium: 141 mmol/L (ref 135–146)
Total Protein: 6.9 g/dL (ref 6.1–8.1)

## 2018-11-19 LAB — HEMOGLOBIN A1C
EAG (MMOL/L): 8.2 (calc)
HEMOGLOBIN A1C: 6.8 %{Hb} — AB (ref <5.7)
Mean Plasma Glucose: 148 (calc)

## 2018-11-19 LAB — TSH: TSH: 9.46 m[IU]/L — AB (ref 0.40–4.50)

## 2018-11-19 LAB — T4, FREE: Free T4: 1.2 ng/dL (ref 0.8–1.8)

## 2018-11-19 LAB — VITAMIN D 25 HYDROXY (VIT D DEFICIENCY, FRACTURES): Vit D, 25-Hydroxy: 25 ng/mL — ABNORMAL LOW (ref 30–100)

## 2018-11-24 ENCOUNTER — Ambulatory Visit (INDEPENDENT_AMBULATORY_CARE_PROVIDER_SITE_OTHER): Payer: BC Managed Care – PPO | Admitting: "Endocrinology

## 2018-11-24 ENCOUNTER — Encounter: Payer: Self-pay | Admitting: "Endocrinology

## 2018-11-24 DIAGNOSIS — E1165 Type 2 diabetes mellitus with hyperglycemia: Secondary | ICD-10-CM | POA: Diagnosis not present

## 2018-11-24 DIAGNOSIS — E890 Postprocedural hypothyroidism: Secondary | ICD-10-CM | POA: Diagnosis not present

## 2018-11-24 DIAGNOSIS — E782 Mixed hyperlipidemia: Secondary | ICD-10-CM

## 2018-11-24 NOTE — Progress Notes (Signed)
Endocrinology Telephone Visit Follow up Note -During COVID -19 Pandemic   Subjective:    Patient ID: Eric Glass, male    DOB: 1961/06/22, PCP Ignatius Specking, MD.   Past Medical History:  Diagnosis Date  . Arthritis   . Chest pain   . CVA (cerebral infarction)   . Diabetes mellitus without complication (HCC)   . Headache   . Hypertension   . Neck pain   . Stroke [facility])    Past Surgical History:  Procedure Laterality Date  . CARDIAC CATHETERIZATION N/A 05/01/2016   Procedure: Left Heart Cath and Coronary Angiography;  Surgeon: Yates Decamp, MD;  Location: [facility] INVASIVE CV LAB;  Service: Cardiovascular;  Laterality: N/A;  . rotator cuff replacement Left    Social History   Socioeconomic History  . Marital status: Married    Spouse name: Not on file  . Number of children: Not on file  . Years of education: Not on file  . Highest education level: Not on file  Occupational History  . Not on file  Social Needs  . Financial resource strain: Not on file  . Food insecurity:    Worry: Not on file    Inability: Not on file  . Transportation needs:    Medical: Not on file    Non-medical: Not on file  Tobacco Use  . Smoking status: Former Smoker    Last attempt to quit: 02/09/2015    Years since quitting: 3.7  . Smokeless tobacco: Never Used  Substance and Sexual Activity  . Alcohol use: No    Alcohol/week: 0.0 standard drinks  . Drug use: No  . Sexual activity: Not on file  Lifestyle  . Physical activity:    Days per week: Not on file    Minutes per session: Not on file  . Stress: Not on file  Relationships  . Social connections:    Talks on phone: Not on file    Gets together: Not on file    Attends religious service: Not on file    Active member of club or organization: Not on file    Attends meetings of clubs or organizations: Not on file    Relationship status: Not on file  Other Topics Concern  . Not on file   Social History Narrative   Drinks 1 cup of caffeine daily.   Outpatient Encounter Medications as of 11/24/2018  Medication Sig  . alfuzosin (UROXATRAL) 10 MG 24 hr tablet Take 10 mg by mouth daily with breakfast.  . amLODipine-benazepril (LOTREL) 10-20 MG per capsule Take 1 capsule by mouth daily.  Marland Kitchen atorvastatin (LIPITOR) 10 MG tablet Take 10 mg by mouth every evening.   . Blood Glucose Monitoring Suppl (ACCU-CHEK AVIVA) device Use as instructed  . clopidogrel (PLAVIX) 75 MG tablet TAKE ONE TABLET BY MOUTH ONCE DAILY  . DEXILANT 60 MG capsule   . glipiZIDE (GLUCOTROL) 5 MG tablet Take 1 tablet (5 mg total) by mouth 2 (two) times daily before a meal.  . glucose blood (ACCU-CHEK AVIVA) test strip Use to test blood glucose 2 times a day.  . levothyroxine (SYNTHROID, LEVOTHROID) 75 MCG tablet Take 1 tablet (75 mcg total) by  mouth daily before breakfast.  . meclizine (ANTIVERT) 25 MG tablet   . metFORMIN (GLUCOPHAGE) 1000 MG tablet Take 1 tablet (1,000 mg total) by mouth See admin instructions. 2 tablets (1000 mg) in the morning, and 1 tablet (500 mg) every evening  . methocarbamol (ROBAXIN) 500 MG tablet Take 500 mg by mouth every 8 (eight) hours as needed for muscle spasms.  . metoprolol succinate (TOPROL-XL) 100 MG 24 hr tablet Take 1 tablet by mouth daily.   . Semaglutide,0.25 or 0.5MG /DOS, (OZEMPIC, 0.25 OR 0.5 MG/DOSE,) 2 MG/1.5ML SOPN Inject into the skin once a week.  . vitamin B-12 (CYANOCOBALAMIN) 1000 MCG tablet Take 1,000 mcg by mouth daily.   No facility-administered encounter medications on file as of 11/24/2018.    ALLERGIES: Allergies  Allergen Reactions  . Bee Venom Anaphylaxis    EPIPEN required  . Contrast Media [Iodinated Diagnostic Agents] Nausea Only   VACCINATION STATUS:  There is no immunization history on file for this patient.  HPI  58 year old male patient returning for follow-up.  He is status post radioactive iodine thyroid ablation for hyperthyroidism.   He received his treatment on March 14, 2018. -He is currently on levothyroxine 100 mcg p.o. every morning.  He reports improvement in his previous symptoms of thyrotoxicosis.    -He also has type 2 diabetes currently on metformin and glipizide. -His previsit labs show proved A1c of 6.8%, progressively improving from 9.2%.     Objective:    There were no vitals taken for this visit.  Wt Readings from Last 3 Encounters:  08/26/18 233 lb (105.7 kg)  05/26/18 216 lb (98 kg)  04/10/18 210 lb (95.3 kg)     Results for orders placed or performed in visit on 08/26/18  Hemoglobin A1c  Result Value Ref Range   Hgb A1c MFr Bld 6.8 (H) <5.7 % of total Hgb   Mean Plasma Glucose 148 (calc)   eAG (mmol/L) 8.2 (calc)  COMPLETE METABOLIC PANEL WITH GFR  Result Value Ref Range   Glucose, Bld 109 65 - 139 mg/dL   BUN 16 7 - 25 mg/dL   Creat 2.44 0.10 - 2.72 mg/dL   GFR, Est Non African American 67 > OR = 60 mL/min/1.25m2   GFR, Est African American 78 > OR = 60 mL/min/1.37m2   BUN/Creatinine Ratio NOT APPLICABLE 6 - 22 (calc)   Sodium 141 135 - 146 mmol/L   Potassium 3.7 3.5 - 5.3 mmol/L   Chloride 106 98 - 110 mmol/L   CO2 28 20 - 32 mmol/L   Calcium 9.4 8.6 - 10.3 mg/dL   Total Protein 6.9 6.1 - 8.1 g/dL   Albumin 4.5 3.6 - 5.1 g/dL   Globulin 2.4 1.9 - 3.7 g/dL (calc)   AG Ratio 1.9 1.0 - 2.5 (calc)   Total Bilirubin 0.3 0.2 - 1.2 mg/dL   Alkaline phosphatase (APISO) 78 35 - 144 U/L   AST 19 10 - 35 U/L   ALT 21 9 - 46 U/L  TSH  Result Value Ref Range   TSH 9.46 (H) 0.40 - 4.50 mIU/L  T4, free  Result Value Ref Range   Free T4 1.2 0.8 - 1.8 ng/dL  VITAMIN D 25 Hydroxy (Vit-D Deficiency, Fractures)  Result Value Ref Range   Vit D, 25-Hydroxy 25 (L) 30 - 100 ng/mL   - Nuclear medicine study in February 2018 showed uniform uptake of 26% . 06/21/2016 TSH 0.175, 08/24/2016  TSH < 0.006. -Nuclear medicine study in March  2019   homogenous  uptake of 19%.  Questionable warm nodule in  the left lobe of the thyroid  Status post I-131 thyroid ablation on March 14, 2018.   Assessment & Plan:   1.  RAI induced hypothyroidism -Since his last visit, his PMD increase his levothyroxine to 100 mcg p.o. daily before breakfast.  He is advised to continue the same dose.  - We discussed about the correct intake of his thyroid hormone, on empty stomach at fasting, with water, separated by at least 30 minutes from breakfast and other medications,  and separated by more than 4 hours from calcium, iron, multivitamins, acid reflux medications (PPIs). -Patient is made aware of the fact that thyroid hormone replacement is needed for life, dose to be adjusted by periodic monitoring of thyroid function tests.    2.  Type 2 diabetes:   -His previsit labs show A1c of 6.8%, improving from 9.2%.   -He has accepted Ozempic injection by his PMD in the interim.  I advised him to continue 0.5 mg of Ozempic weekly, continue metformin 1000 mg p.o. twice daily,and glipizide to 5 mg p.o. twice daily with breakfast and supper.  -He has previously declined an offer for basal insulin.   - I advised patient to maintain close follow up with Ignatius Specking, MD for primary care needs.  Follow up plan: Return in about 3 months (around 02/24/2019) for Follow up with Pre-visit Labs.  Marquis Lunch, MD Phone: [phone]  Fax: [phone]  -  This note was partially dictated with voice recognition software. Similar sounding words can be transcribed inadequately or may not  be corrected upon review.  11/24/2018, 12:36 PM

## 2019-03-02 ENCOUNTER — Ambulatory Visit: Payer: BC Managed Care – PPO | Admitting: "Endocrinology

## 2019-03-11 LAB — COMPLETE METABOLIC PANEL WITH GFR
AG RATIO: 1.6 (calc) (ref 1.0–2.5)
ALBUMIN MSPROF: 4.4 g/dL (ref 3.6–5.1)
ALT: 26 U/L (ref 9–46)
AST: 23 U/L (ref 10–35)
Alkaline phosphatase (APISO): 75 U/L (ref 35–144)
BUN/Creatinine Ratio: 10 (calc) (ref 6–22)
BUN: 14 mg/dL (ref 7–25)
CALCIUM: 9.6 mg/dL (ref 8.6–10.3)
CHLORIDE: 107 mmol/L (ref 98–110)
CO2: 26 mmol/L (ref 20–32)
CREATININE: 1.38 mg/dL — AB (ref 0.70–1.33)
GFR, EST NON AFRICAN AMERICAN: 56 mL/min/{1.73_m2} — AB (ref >=60)
GFR, Est African American: 65 mL/min/{1.73_m2} (ref >=60)
GLOBULIN: 2.7 g/dL (ref 1.9–3.7)
Glucose, Bld: 153 mg/dL — ABNORMAL HIGH (ref 65–99)
POTASSIUM: 3.6 mmol/L (ref 3.5–5.3)
SODIUM: 141 mmol/L (ref 135–146)
Total Bilirubin: 0.6 mg/dL (ref 0.2–1.2)
Total Protein: 7.1 g/dL (ref 6.1–8.1)

## 2019-03-11 LAB — HEMOGLOBIN A1C
Hgb A1c MFr Bld: 7.6 %{Hb} — ABNORMAL HIGH
Mean Plasma Glucose: 171 (calc)
eAG (mmol/L): 9.5 (calc)

## 2019-03-11 LAB — T4, FREE: Free T4: 1.2 ng/dL (ref 0.8–1.8)

## 2019-03-11 LAB — TSH: TSH: 6.11 m[IU]/L — ABNORMAL HIGH (ref 0.40–4.50)

## 2019-03-17 ENCOUNTER — Ambulatory Visit (INDEPENDENT_AMBULATORY_CARE_PROVIDER_SITE_OTHER): Payer: BC Managed Care – PPO | Admitting: "Endocrinology

## 2019-03-17 ENCOUNTER — Encounter: Payer: Self-pay | Admitting: "Endocrinology

## 2019-03-17 ENCOUNTER — Other Ambulatory Visit: Payer: Self-pay

## 2019-03-17 DIAGNOSIS — E1165 Type 2 diabetes mellitus with hyperglycemia: Secondary | ICD-10-CM

## 2019-03-17 DIAGNOSIS — E782 Mixed hyperlipidemia: Secondary | ICD-10-CM

## 2019-03-17 DIAGNOSIS — E890 Postprocedural hypothyroidism: Secondary | ICD-10-CM

## 2019-03-17 MED ORDER — LEVOTHYROXINE SODIUM 100 MCG PO TABS: 100.0000 ug | ORAL_TABLET | Freq: Every day | ORAL | 6 refills | Status: DC

## 2019-03-17 NOTE — Progress Notes (Signed)
03/17/2019                                Endocrinology Telehealth Visit Follow up Note -During COVID -19 Pandemic  I connected with Eric Glass on 03/17/2019   by telephone and verified that I am speaking with the correct person using two identifiers. Eric Glass, 19-Jan-1961. he has verbally consented to this visit. All issues noted in this document were discussed and addressed. The format was not optimal for physical exam.    Subjective:    Patient ID: Eric Glass, male    DOB: 12-29-1960, PCP Glenda Chroman, MD.   Past Medical History:  Diagnosis Date  . Arthritis   . Chest pain   . CVA (cerebral infarction)   . Diabetes mellitus without complication ([place])   . Headache   . Hypertension   . Neck pain   . Stroke [facility])    Past Surgical History:  Procedure Laterality Date  . CARDIAC CATHETERIZATION N/A 05/01/2016   Procedure: Left Heart Cath and Coronary Angiography;  Surgeon: Adrian Prows, MD;  Location: [facility];  Service: Cardiovascular;  Laterality: N/A;  . rotator cuff replacement Left    Social History   Socioeconomic History  . Marital status: Married    Spouse name: Not on file  . Number of children: Not on file  . Years of education: Not on file  . Highest education level: Not on file  Occupational History  . Not on file  Social Needs  . Financial resource strain: Not on file  . Food insecurity    Worry: Not on file    Inability: Not on file  . Transportation needs    Medical: Not on file    Non-medical: Not on file  Tobacco Use  . Smoking status: Former Smoker    Quit date: 02/09/2015    Years since quitting: 4.1  . Smokeless tobacco: Never Used  Substance and Sexual Activity  . Alcohol use: No    Alcohol/week: 0.0 standard drinks  . Drug use: No  . Sexual activity: Not on file  Lifestyle  . Physical activity    Days per week: Not on file    Minutes per session: Not on file  . Stress: Not on file  Relationships  . Social Product manager on phone: Not on file    Gets together: Not on file    Attends religious service: Not on file    Active member of club or organization: Not on file    Attends meetings of clubs or organizations: Not on file    Relationship status: Not on file  Other Topics Concern  . Not on file  Social History Narrative   Drinks 1 cup of caffeine daily.   Outpatient Encounter Medications as of 03/17/2019  Medication Sig  . alfuzosin (UROXATRAL) 10 MG 24 hr tablet Take 10 mg by mouth daily with breakfast.  . amLODipine-benazepril (LOTREL) 10-20 MG per capsule Take 1 capsule by mouth daily.  Marland Kitchen atorvastatin (LIPITOR) 10 MG tablet Take 10 mg by mouth every evening.   . Blood Glucose Monitoring Suppl (ACCU-CHEK AVIVA) device Use as instructed  . clopidogrel (PLAVIX) 75 MG tablet TAKE ONE TABLET BY MOUTH ONCE DAILY  . DEXILANT 60 MG capsule   . glipiZIDE (GLUCOTROL) 5 MG tablet Take 1 tablet (5 mg total) by mouth 2 (two) times daily before  a meal.  . glucose blood (ACCU-CHEK AVIVA) test strip Use to test blood glucose 2 times a day.  . levothyroxine (SYNTHROID) 100 MCG tablet Take 1 tablet (100 mcg total) by mouth daily before breakfast.  . meclizine (ANTIVERT) 25 MG tablet   . metFORMIN (GLUCOPHAGE) 1000 MG tablet Take 1 tablet (1,000 mg total) by mouth See admin instructions. 2 tablets (1000 mg) in the morning, and 1 tablet (500 mg) every evening  . methocarbamol (ROBAXIN) 500 MG tablet Take 500 mg by mouth every 8 (eight) hours as needed for muscle spasms.  . metoprolol succinate (TOPROL-XL) 100 MG 24 hr tablet Take 1 tablet by mouth daily.   . Semaglutide,0.25 or 0.5MG /DOS, (OZEMPIC, 0.25 OR 0.5 MG/DOSE,) 2 MG/1.5ML SOPN Inject into the skin once a week.  . vitamin B-12 (CYANOCOBALAMIN) 1000 MCG tablet Take 1,000 mcg by mouth daily.  . [DISCONTINUED] levothyroxine (SYNTHROID, LEVOTHROID) 75 MCG tablet Take 1 tablet (75 mcg total) by mouth daily before breakfast.   No facility-administered  encounter medications on file as of 03/17/2019.    ALLERGIES: Allergies  Allergen Reactions  . Bee Venom Anaphylaxis    EPIPEN required  . Contrast Media [Iodinated Diagnostic Agents] Nausea Only   VACCINATION STATUS:  There is no immunization history on file for this patient.  HPI  58 year old male patient returning for follow-up.  He is status post radioactive iodine thyroid ablation for hyperthyroidism.  He received his treatment on March 14, 2018. -He is currently on levothyroxine 75 mcg p.o. every morning.  He reports improvement in his previous symptoms of thyrotoxicosis.    -He also has type 2 diabetes currently on metformin and glipizide. -His previsit labs show proved A1c of 7.6%, generally improving from 9.2%.   He has no new complaints today.  Review of system: Limited as above. Objective:    There were no vitals taken for this visit.  Wt Readings from Last 3 Encounters:  08/26/18 233 lb (105.7 kg)  05/26/18 216 lb (98 kg)  04/10/18 210 lb (95.3 kg)     Results for orders placed or performed in visit on 11/24/18  Hemoglobin A1c  Result Value Ref Range   Hgb A1c MFr Bld 7.6 (H) <5.7 % of total Hgb   Mean Plasma Glucose 171 (calc)   eAG (mmol/L) 9.5 (calc)  COMPLETE METABOLIC PANEL WITH GFR  Result Value Ref Range   Glucose, Bld 153 (H) 65 - 99 mg/dL   BUN 14 7 - 25 mg/dL   Creat 3.551.38 (H) 7.320.70 - 1.33 mg/dL   GFR, Est Non African American 56 (L) > OR = 60 mL/min/1.5273m2   GFR, Est African American 65 > OR = 60 mL/min/1.5773m2   BUN/Creatinine Ratio 10 6 - 22 (calc)   Sodium 141 135 - 146 mmol/L   Potassium 3.6 3.5 - 5.3 mmol/L   Chloride 107 98 - 110 mmol/L   CO2 26 20 - 32 mmol/L   Calcium 9.6 8.6 - 10.3 mg/dL   Total Protein 7.1 6.1 - 8.1 g/dL   Albumin 4.4 3.6 - 5.1 g/dL   Globulin 2.7 1.9 - 3.7 g/dL (calc)   AG Ratio 1.6 1.0 - 2.5 (calc)   Total Bilirubin 0.6 0.2 - 1.2 mg/dL   Alkaline phosphatase (APISO) 75 35 - 144 U/L   AST 23 10 - 35 U/L   ALT 26  9 - 46 U/L  T4, free  Result Value Ref Range   Free T4 1.2 0.8 - 1.8 ng/dL  TSH  Result Value Ref Range   TSH 6.11 (H) 0.40 - 4.50 mIU/L   - Nuclear medicine study in February 2018 showed uniform uptake of 26% . 06/21/2016 TSH 0.175, 08/24/2016  TSH < 0.006. -Nuclear medicine study in March 2019   homogenous  uptake of 19%.  Questionable warm nodule in the left lobe of the thyroid  Status post I-131 thyroid ablation on March 14, 2018.   Assessment & Plan:   1.  RAI induced hypothyroidism -His previsit labs show inadequate replacement.  I discussed and increase his levothyroxine to 100 mcg p.o. daily before breakfast.    - We discussed about the correct intake of his thyroid hormone, on empty stomach at fasting, with water, separated by at least 30 minutes from breakfast and other medications,  and separated by more than 4 hours from calcium, iron, multivitamins, acid reflux medications (PPIs). -Patient is made aware of the fact that thyroid hormone replacement is needed for life, dose to be adjusted by periodic monitoring of thyroid function tests.    2.  Type 2 diabetes:   -His previsit labs show A1c of 7.6%, improving from 9.2%.   -He is advised to continue Ozempic 0.5 mg subcutaneously weekly,  continue metformin 1000 mg p.o. twice daily,and glipizide to 5 mg p.o. twice daily with breakfast and supper.     - I advised patient to maintain close follow up with Ignatius SpeckingVyas, Dhruv B, MD for primary care needs.  Time for this visit: 15 minutes. Laurann MontanaJerry A Capistran  participated in the discussions, expressed understanding, and voiced agreement with the above plans.  All questions were answered to his satisfaction. he is encouraged to contact clinic should he have any questions or concerns prior to his return visit.  Follow up plan: Return in about 6 months (around 09/17/2019) for Follow up with Pre-visit Labs, Meter, and Logs.  Marquis LunchGebre Nida, MD Phone: [phone]  Fax: [phone]  -  This  note was partially dictated with voice recognition software. Similar sounding words can be transcribed inadequately or may not  be corrected upon review.  03/17/2019, 4:41 PM

## 2019-09-22 ENCOUNTER — Ambulatory Visit: Payer: BC Managed Care – PPO | Admitting: "Endocrinology

## 2019-10-19 ENCOUNTER — Other Ambulatory Visit: Payer: Self-pay

## 2019-10-19 DIAGNOSIS — E890 Postprocedural hypothyroidism: Secondary | ICD-10-CM

## 2019-10-19 DIAGNOSIS — E1165 Type 2 diabetes mellitus with hyperglycemia: Secondary | ICD-10-CM

## 2019-10-19 DIAGNOSIS — I10 Essential (primary) hypertension: Secondary | ICD-10-CM

## 2019-10-20 LAB — COMPREHENSIVE METABOLIC PANEL
AG Ratio: 1.8 (calc) (ref 1.0–2.5)
ALT: 23 U/L (ref 9–46)
AST: 25 U/L (ref 10–35)
Albumin: 4.4 g/dL (ref 3.6–5.1)
Alkaline phosphatase (APISO): 71 U/L (ref 35–144)
BUN: 11 mg/dL (ref 7–25)
CO2: 26 mmol/L (ref 20–32)
Calcium: 9.5 mg/dL (ref 8.6–10.3)
Chloride: 103 mmol/L (ref 98–110)
Creat: 1.17 mg/dL (ref 0.70–1.33)
Globulin: 2.4 g/dL (calc) (ref 1.9–3.7)
Glucose, Bld: 217 mg/dL — ABNORMAL HIGH (ref 65–139)
Potassium: 3.8 mmol/L (ref 3.5–5.3)
Sodium: 139 mmol/L (ref 135–146)
Total Bilirubin: 0.4 mg/dL (ref 0.2–1.2)
Total Protein: 6.8 g/dL (ref 6.1–8.1)

## 2019-10-20 LAB — T4, FREE: Free T4: 1 ng/dL (ref 0.8–1.8)

## 2019-10-20 LAB — HEMOGLOBIN A1C
Hgb A1c MFr Bld: 7.8 % of total Hgb — ABNORMAL HIGH (ref <5.7)
Mean Plasma Glucose: 177 (calc)
eAG (mmol/L): 9.8 (calc)

## 2019-10-20 LAB — TSH: TSH: 11.51 mIU/L — ABNORMAL HIGH (ref 0.40–4.50)

## 2019-11-25 ENCOUNTER — Encounter (HOSPITAL_COMMUNITY): Payer: Self-pay

## 2019-11-25 ENCOUNTER — Other Ambulatory Visit: Payer: Self-pay

## 2019-11-25 ENCOUNTER — Emergency Department (HOSPITAL_COMMUNITY)
Admission: EM | Admit: 2019-11-25 | Discharge: 2019-11-25 | Disposition: A | Discharge status: Home / Self Care | Payer: BC Managed Care – PPO | Attending: Emergency Medicine | Admitting: Emergency Medicine

## 2019-11-25 ENCOUNTER — Emergency Department (HOSPITAL_COMMUNITY): Payer: BC Managed Care – PPO

## 2019-11-25 DIAGNOSIS — K760 Fatty (change of) liver, not elsewhere classified: Secondary | ICD-10-CM | POA: Insufficient documentation

## 2019-11-25 DIAGNOSIS — K219 Gastro-esophageal reflux disease without esophagitis: Secondary | ICD-10-CM

## 2019-11-25 DIAGNOSIS — E119 Type 2 diabetes mellitus without complications: Secondary | ICD-10-CM | POA: Insufficient documentation

## 2019-11-25 DIAGNOSIS — Z79899 Other long term (current) drug therapy: Secondary | ICD-10-CM | POA: Diagnosis not present

## 2019-11-25 DIAGNOSIS — R1013 Epigastric pain: Secondary | ICD-10-CM | POA: Diagnosis present

## 2019-11-25 DIAGNOSIS — I10 Essential (primary) hypertension: Secondary | ICD-10-CM | POA: Diagnosis not present

## 2019-11-25 DIAGNOSIS — Z7984 Long term (current) use of oral hypoglycemic drugs: Secondary | ICD-10-CM | POA: Insufficient documentation

## 2019-11-25 LAB — CBC
HCT: 40.7 % (ref 39.0–52.0)
Hemoglobin: 13 g/dL (ref 13.0–17.0)
MCH: 27.5 pg (ref 26.0–34.0)
MCHC: 31.9 g/dL (ref 30.0–36.0)
MCV: 86.2 fL (ref 80.0–100.0)
Platelets: 289 10*3/uL (ref 150–400)
RBC: 4.72 MIL/uL (ref 4.22–5.81)
RDW: 12.2 % (ref 11.5–15.5)
WBC: 8.3 10*3/uL (ref 4.0–10.5)
nRBC: 0 % (ref 0.0–0.2)

## 2019-11-25 LAB — URINALYSIS, ROUTINE W REFLEX MICROSCOPIC
Bacteria, UA: NONE SEEN
Bilirubin Urine: NEGATIVE
Glucose, UA: 50 mg/dL — AB
Hgb urine dipstick: NEGATIVE
Ketones, ur: NEGATIVE mg/dL
Leukocytes,Ua: NEGATIVE
Nitrite: NEGATIVE
Protein, ur: 100 mg/dL — AB
Specific Gravity, Urine: 1.013 (ref 1.005–1.030)
pH: 7 (ref 5.0–8.0)

## 2019-11-25 LAB — COMPREHENSIVE METABOLIC PANEL
ALT: 25 U/L (ref 0–44)
AST: 24 U/L (ref 15–41)
Albumin: 4 g/dL (ref 3.5–5.0)
Alkaline Phosphatase: 76 U/L (ref 38–126)
Anion gap: 11 (ref 5–15)
BUN: 10 mg/dL (ref 6–20)
CO2: 26 mmol/L (ref 22–32)
Calcium: 9.2 mg/dL (ref 8.9–10.3)
Chloride: 104 mmol/L (ref 98–111)
Creatinine, Ser: 1.32 mg/dL — ABNORMAL HIGH (ref 0.61–1.24)
GFR calc Af Amer: >60 mL/min (ref >60)
GFR calc non Af Amer: 59 mL/min — ABNORMAL LOW (ref >60)
Glucose, Bld: 171 mg/dL — ABNORMAL HIGH (ref 70–99)
Potassium: 3.4 mmol/L — ABNORMAL LOW (ref 3.5–5.1)
Sodium: 141 mmol/L (ref 135–145)
Total Bilirubin: 0.8 mg/dL (ref 0.3–1.2)
Total Protein: 7.4 g/dL (ref 6.5–8.1)

## 2019-11-25 LAB — TROPONIN I (HIGH SENSITIVITY): Troponin I (High Sensitivity): 6 ng/L (ref <18)

## 2019-11-25 LAB — LIPASE, BLOOD: Lipase: 45 U/L (ref 11–51)

## 2019-11-25 MED ORDER — MORPHINE SULFATE (PF) 4 MG/ML IV SOLN
4.0000 mg | Freq: Once | INTRAVENOUS | Status: AC
  Administered 2019-11-25: 4 mg via INTRAVENOUS
  Filled 2019-11-25: qty 1

## 2019-11-25 MED ORDER — ONDANSETRON 4 MG PO TBDP: 4.0000 mg | ORAL_TABLET | Freq: Four times a day (QID) | ORAL | 0 refills | Status: DC | PRN

## 2019-11-25 MED ORDER — ALUM & MAG HYDROXIDE-SIMETH 200-200-20 MG/5ML PO SUSP
30.0000 mL | Freq: Once | ORAL | Status: AC
  Administered 2019-11-25: 30 mL via ORAL
  Filled 2019-11-25: qty 30

## 2019-11-25 MED ORDER — SODIUM CHLORIDE 0.9 % IV BOLUS (SEPSIS)
1000.0000 mL | Freq: Once | INTRAVENOUS | Status: AC
  Administered 2019-11-25: 1000 mL via INTRAVENOUS

## 2019-11-25 MED ORDER — PANTOPRAZOLE SODIUM 40 MG IV SOLR
40.0000 mg | Freq: Once | INTRAVENOUS | Status: AC
  Administered 2019-11-25: 40 mg via INTRAVENOUS
  Filled 2019-11-25: qty 40

## 2019-11-25 MED ORDER — LIDOCAINE VISCOUS HCL 2 % MT SOLN
15.0000 mL | Freq: Once | OROMUCOSAL | Status: AC
  Administered 2019-11-25: 15 mL via ORAL
  Filled 2019-11-25: qty 15

## 2019-11-25 MED ORDER — PANTOPRAZOLE SODIUM 40 MG PO TBEC: 40.0000 mg | DELAYED_RELEASE_TABLET | Freq: Every day | ORAL | 1 refills | Status: AC

## 2019-11-25 MED ORDER — OXYCODONE-ACETAMINOPHEN 5-325 MG PO TABS: 2.0000 | ORAL_TABLET | Freq: Four times a day (QID) | ORAL | 0 refills | Status: DC | PRN

## 2019-11-25 MED ORDER — ONDANSETRON HCL 4 MG/2ML IJ SOLN
4.0000 mg | Freq: Once | INTRAMUSCULAR | Status: AC
  Administered 2019-11-25: 4 mg via INTRAVENOUS
  Filled 2019-11-25: qty 2

## 2019-11-25 NOTE — ED Provider Notes (Addendum)
TIME SEEN: 5:06 PM  CHIEF COMPLAINT: Upper abdominal pain  HPI: Patient is a 59 year old male with history of diabetes, CVA who presents to the emergency department with upper abdominal pain that he describes as a sharp, burning pain that started Monday after eating ACP.  States he went to the emergency department at [facility] last night and had labs and a CT scan.  States he was given something to drink which helped his pain.  Was discharged with medications but he has not filled them yet.  Reports after eating a steak and gravy biscuit his pain got worse.  Woke up this morning and it radiated to the left shoulder.  No chest pain or shortness of breath.  No nausea, vomiting, diarrhea, bloody stools, melena.  No history of alcohol use or NSAID use.  No history of abdominal surgeries.  Pain also worse with lying flat.  ROS: See HPI Constitutional: no fever  Eyes: no drainage  ENT: no runny nose   Cardiovascular:  no chest pain  Resp: no SOB  GI: no vomiting GU: no dysuria Integumentary: no rash  Allergy: no hives  Musculoskeletal: no leg swelling  Neurological: no slurred speech ROS otherwise negative  PAST MEDICAL HISTORY/PAST SURGICAL HISTORY:  Past Medical History:  Diagnosis Date  . Arthritis   . Chest pain   . CVA (cerebral infarction)   . Diabetes mellitus without complication ([facility])   . Headache   . Hypertension   . Neck pain   . Stroke [facility])     MEDICATIONS:  Prior to Admission medications   Medication Sig Start Date End Date Taking? Authorizing Provider  alfuzosin (UROXATRAL) 10 MG 24 hr tablet Take 10 mg by mouth daily with breakfast.    [provider]  amLODipine-benazepril (LOTREL) 10-20 MG per capsule Take 1 capsule by mouth daily.    [provider]  atorvastatin (LIPITOR) 10 MG tablet Take 10 mg by mouth every evening.  03/26/15   [provider]  Blood Glucose Monitoring Suppl (ACCU-CHEK AVIVA) device Use as instructed 11/20/17    Roma Kayser, MD  clopidogrel (PLAVIX) 75 MG tablet TAKE ONE TABLET BY MOUTH ONCE DAILY 04/02/16   Marvel Plan, MD  DEXILANT 60 MG capsule  03/04/17   [provider]  glipiZIDE (GLUCOTROL) 5 MG tablet Take 1 tablet (5 mg total) by mouth 2 (two) times daily before a meal. 08/26/18   Nida, Denman George, MD  glucose blood (ACCU-CHEK AVIVA) test strip Use to test blood glucose 2 times a day. 11/20/17   Roma Kayser, MD  levothyroxine (SYNTHROID) 100 MCG tablet Take 1 tablet (100 mcg total) by mouth daily before breakfast. 03/17/19   Nida, Denman George, MD  meclizine (ANTIVERT) 25 MG tablet  03/04/17   [provider]  metFORMIN (GLUCOPHAGE) 1000 MG tablet Take 1 tablet (1,000 mg total) by mouth See admin instructions. 2 tablets (1000 mg) in the morning, and 1 tablet (500 mg) every evening 05/26/18   Nida, Denman George, MD  methocarbamol (ROBAXIN) 500 MG tablet Take 500 mg by mouth every 8 (eight) hours as needed for muscle spasms.    [provider]  metoprolol succinate (TOPROL-XL) 100 MG 24 hr tablet Take 1 tablet by mouth daily.  01/30/16   [provider]  Semaglutide,0.25 or 0.5MG /DOS, (OZEMPIC, 0.25 OR 0.5 MG/DOSE,) 2 MG/1.5ML SOPN Inject into the skin once a week.    [provider]  vitamin B-12 (CYANOCOBALAMIN) 1000 MCG tablet Take 1,000  mcg by mouth daily.    [provider]    ALLERGIES:  Allergies  Allergen Reactions  . Bee Venom Anaphylaxis    EPIPEN required  . Contrast Media [Iodinated Diagnostic Agents] Nausea Only    SOCIAL HISTORY:  Social History   Tobacco Use  . Smoking status: Former Smoker    Quit date: 02/09/2015    Years since quitting: 4.7  . Smokeless tobacco: Never Used  Substance Use Topics  . Alcohol use: No    Alcohol/week: 0.0 standard drinks    FAMILY HISTORY: Family History  Problem Relation Age of Onset  . Stroke Father   . Heart disease Father   . Seizures Paternal Uncle      EXAM: BP (!) 171/98 (BP Location: Right Arm)   Pulse 100   Temp 99.3 F (37.4 C) (Oral)   Resp 20   Ht 6' (1.829 m)   Wt 105.2 kg   SpO2 98%   BMI 31.46 kg/m  CONSTITUTIONAL: Alert and oriented and responds appropriately to questions. Well-appearing; well-nourished HEAD: Normocephalic EYES: Conjunctivae clear, pupils appear equal, EOM appear intact ENT: normal nose; moist mucous membranes NECK: Supple, normal ROM CARD: RRR; S1 and S2 appreciated; no murmurs, no clicks, no rubs, no gallops RESP: Normal chest excursion without splinting or tachypnea; breath sounds clear and equal bilaterally; no wheezes, no rhonchi, no rales, no hypoxia or respiratory distress, speaking full sentences ABD/GI: Normal bowel sounds; non-distended; soft, tender throughout the upper abdomen with negative Murphy sign, no rebound, no guarding, no peritoneal signs, no hepatosplenomegaly BACK:  The back appears normal EXT: Normal ROM in all joints; no deformity noted, no edema; no cyanosis SKIN: Normal color for age and race; warm; no rash on exposed skin NEURO: Moves all extremities equally PSYCH: The patient's mood and manner are appropriate.   MEDICAL DECISION MAKING: Patient here with abdominal pain.  Low suspicion for ACS but is having some pain radiating into the left shoulder.  EKG shows no ischemic abnormality.  Will obtain 1 troponin given he does have some risk factors for ACS.  Labs here are reassuring other than mildly elevated creatinine of 1.32.  Will give IV fluids.  LFTs, lipase normal.  States he thinks he had a CT of the abdomen pelvis yesterday at [facility].  We will try to get these records as they are not currently in care everywhere.  Will obtain right upper quadrant ultrasound.  Will treat with Protonix, GI cocktail.  Suspect gastritis versus GERD versus peptic ulcer.  Doubt perforation.  Cholecystitis, cholelithiasis on the differential.  Doubt choledocholithiasis given normal LFTs.   Less likely pancreatitis given normal lipase.  Doubt appendicitis given no lower abdominal pain.  ED PROGRESS: Troponin negative.  Right upper quadrant ultrasound shows fatty liver but no other acute abnormality.  No significant improvement after GI cocktail, Protonix.  Will give morphine, Zofran.  8:10 PM At this time we still have not received records from [facility].  He reports feeling better after morphine and Zofran and I feel he is safe for discharge.  He was able to contact his daughter-in-law and it appears he was prescribed Carafate.  Will discharge with Protonix, Percocet, Zofran.  Recommended outpatient PCP and GI follow-up.  Recommended diet changes.  He reports a CT of his abdomen pelvis yesterday was normal.  I do not feel this needs to be repeated today.  Will discharge home.   At this time, I do not feel there is any life-threatening condition  present. I have reviewed, interpreted and discussed all results (EKG, imaging, lab, urine as appropriate) and exam findings with patient/family. I have reviewed nursing notes and appropriate previous records.  I feel the patient is safe to be discharged home without further emergent workup and can continue workup as an outpatient as needed. Discussed usual and customary return precautions. Patient/family verbalize understanding and are comfortable with this plan.  Outpatient follow-up has been provided as needed. All questions have been answered.    EKG Interpretation  Date/Time:  Wednesday November 25 2019 13:39:28 EDT Ventricular Rate:  98 PR Interval:  160 QRS Duration: 94 QT Interval:  348 QTC Calculation: 444 R Axis:   7 Text Interpretation: Normal sinus rhythm Incomplete right bundle branch block Possible Anterior infarct , age undetermined Abnormal ECG No significant change since last tracing Confirmed by Pryor Curia [phone]) on 11/25/2019 5:06:11 PM          Eric Glass was evaluated in Emergency Department on 11/25/2019 for the  symptoms described in the history of present illness. He was evaluated in the context of the global COVID-19 pandemic, which necessitated consideration that the patient might be at risk for infection with the SARS-CoV-2 virus that causes COVID-19. Institutional protocols and algorithms that pertain to the evaluation of patients at risk for COVID-19 are in a state of rapid change based on information released by regulatory bodies including the CDC and federal and state organizations. These policies and algorithms were followed during the patient's care in the ED.  Patient was seen wearing N95, face shield, gloves.    Eric Glass, Delice Bison, DO 11/25/19 2028    9:45 PM  Rcvd outside hospital records from [facility].  CT of the abdomen pelvis performed without contrast that showed mild layering sludge in the gallbladder without associated inflammatory changes or intra or extrahepatic ductal dilatation.  Pancreas was normal.  Spleen was normal.  Adrenal glands appeared normal.  He did have a 2 mm nonobstructing left upper pole renal calculus but no ureteral or bladder calculi or hydronephrosis.  Bladder appeared normal.  He had a tiny hiatal hernia.  Normal-appearing appendix.  No sign of bowel obstruction.  No abdominal aortic aneurysm.  No abdominopelvic lymphadenopathy.  Prostate appeared normal.  No ascites.  Had a tiny fat-containing periumbilical hernia.  Mild degenerative changes at L5-S1.  Labs showed sodium 135, potassium 3.5, chloride 99, CO2 26, anion gap 13, BUN 15, creatinine 1.32, glucose 183, calcium 9.6, total bilirubin 0.4, AST 23.3,    ALT 20, alkaline phosphatase 81, total protein 7.2, albumin 4.3, lipase slightly elevated at 85.  White blood cell count 8.9, hemoglobin 12.1, platelets 293.  Discharge instructions recommended starting Carafate 1 g p.o. tablets 4 times. daily as needed                         Eric Glass, Delice Bison, DO 11/25/19 2151

## 2019-11-25 NOTE — ED Triage Notes (Signed)
Pt arrives POV for generalized abd pain radiation from chest down to belly. Reports seen last night at hospital and was given GI cocktail w/ relief, but pain started again this AM. Endorses nausea, denies vomiting

## 2019-11-25 NOTE — ED Notes (Signed)
Patient verbalizes understanding of discharge instructions. Opportunity for questioning and answers were provided. Armband removed by staff, pt discharged from ED ambulatory.   

## 2019-11-25 NOTE — Discharge Instructions (Addendum)
Please follow-up with your primary care physician if symptoms continue.  I also recommend scheduling appointment with a gastroenterologist.  Your labs, EKG, right upper quadrant ultrasound showed no acute abnormality other than a fatty liver which would not cause of pain.  We have prescribed you with medications to take at home.  You may also take your Carafate which was prescribed by [facility].   You are being provided a prescription for opiates (also known as narcotics) for pain control.  Opiates can be addictive and should only be used when absolutely necessary for pain control when other alternatives do not work.  We recommend you only use them for the recommended amount of time and only as prescribed.  Please do not take with other sedative medications or alcohol.  Please do not drive, operate machinery, make important decisions while taking opiates.  Please note that these medications can be addictive and have high abuse potential.  Patients can become addicted to narcotics after only taking them for a few days.  Please keep these medications locked away from children, teenagers or any family members with history of substance abuse.  Narcotic pain medicine may also make you constipated.  You may use over-the-counter medications such as MiraLAX, Colace to prevent constipation.  If you become constipated you may use over-the-counter enemas as needed.  Itching and nausea are common side effects of narcotic pain medication.  If you develop uncontrolled vomiting or a rash, please stop these medications.

## 2021-05-09 IMAGING — US US ABDOMEN LIMITED
1 series · 14 of 25 positions shown · non-contrast
Comparison: None.

CLINICAL DATA: Epigastric pain for 3 days

EXAM:
ULTRASOUND ABDOMEN LIMITED RIGHT UPPER QUADRANT

[Series 1: us abdomen limited ruq · 50 acquisitions, 14 frames shown]
[im 1/50]
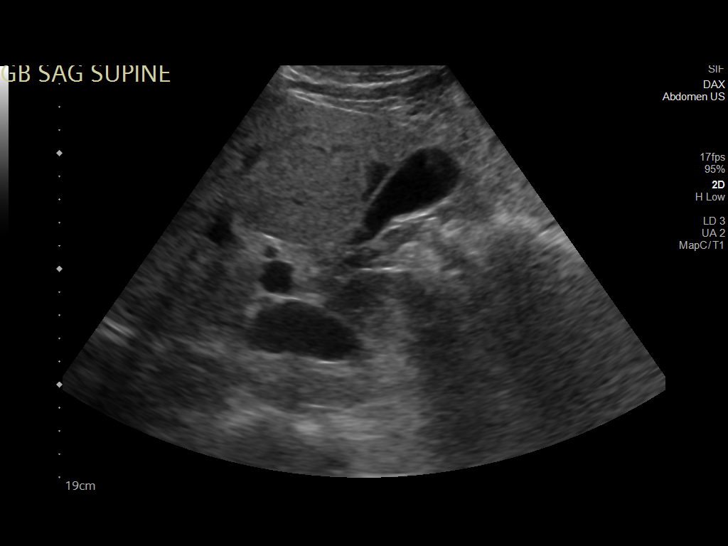
[im 5/50]
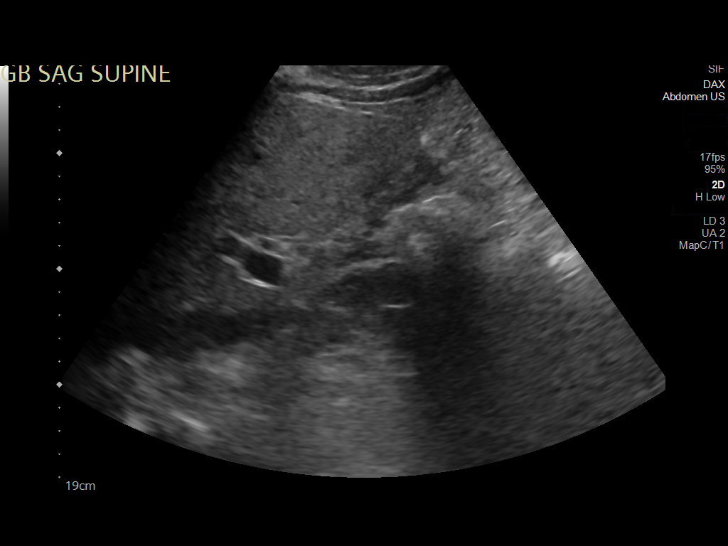
[im 9/50]
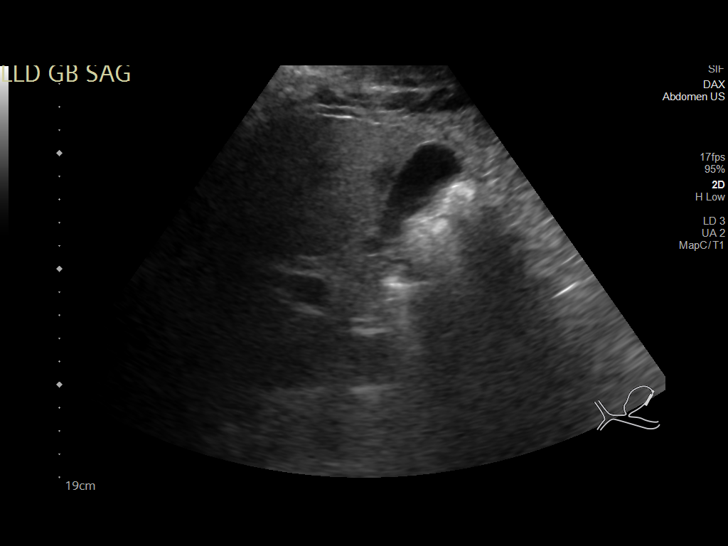
[im 13/50]
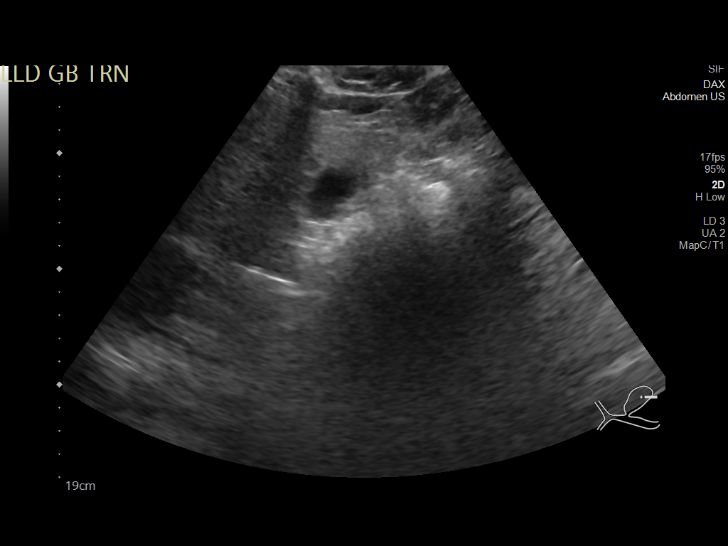
[im 17/50]
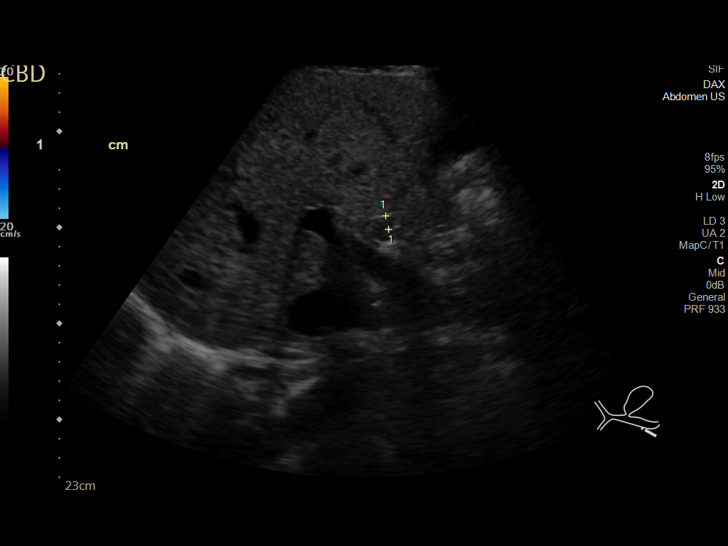
[im 19/50]
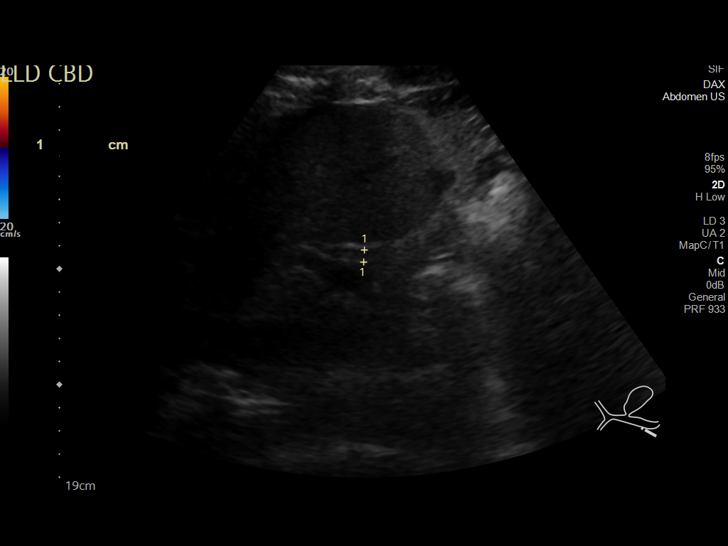
[im 23/50]
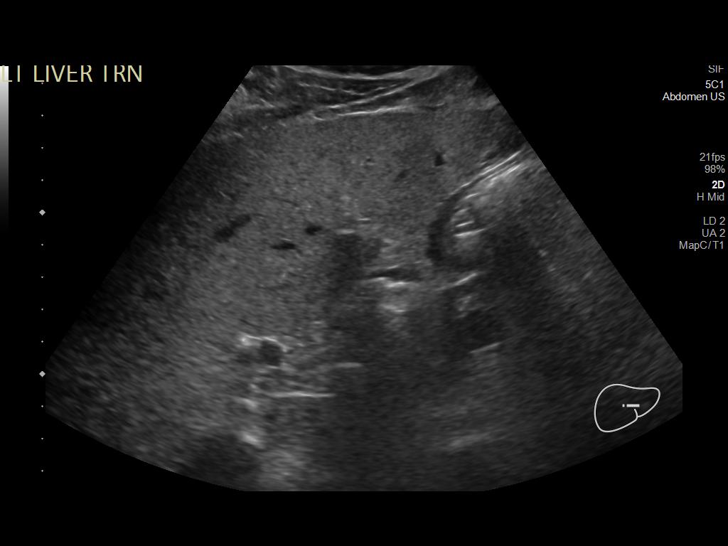
[im 27/50]
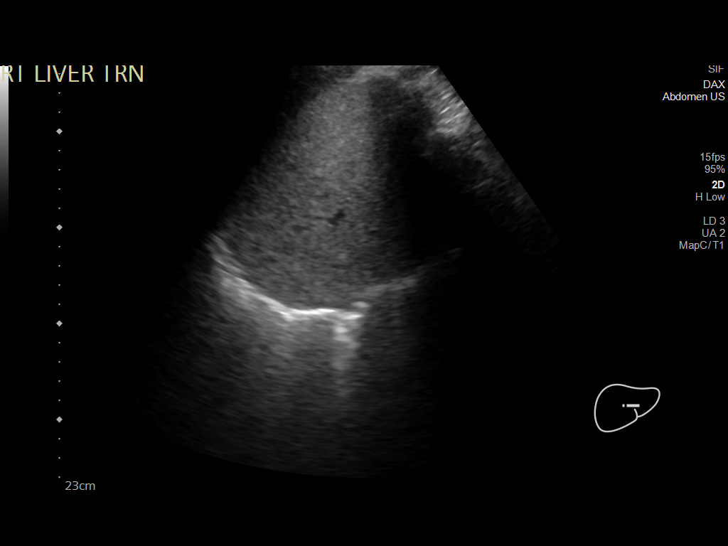
[im 31/50]
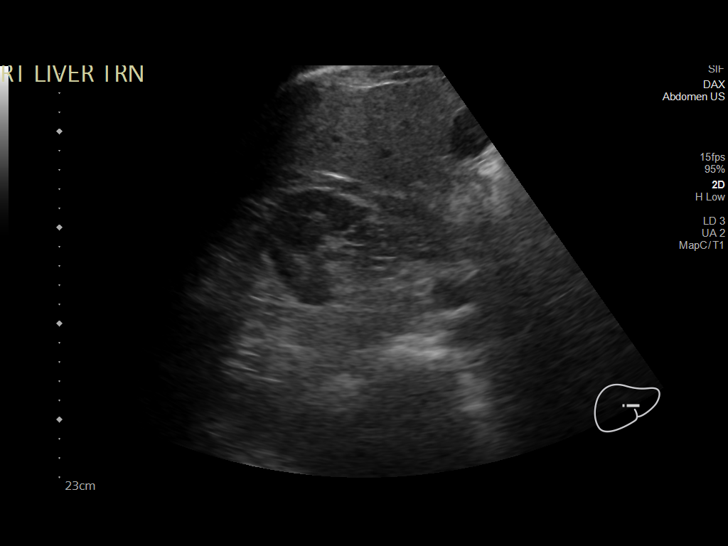
[im 33/50]
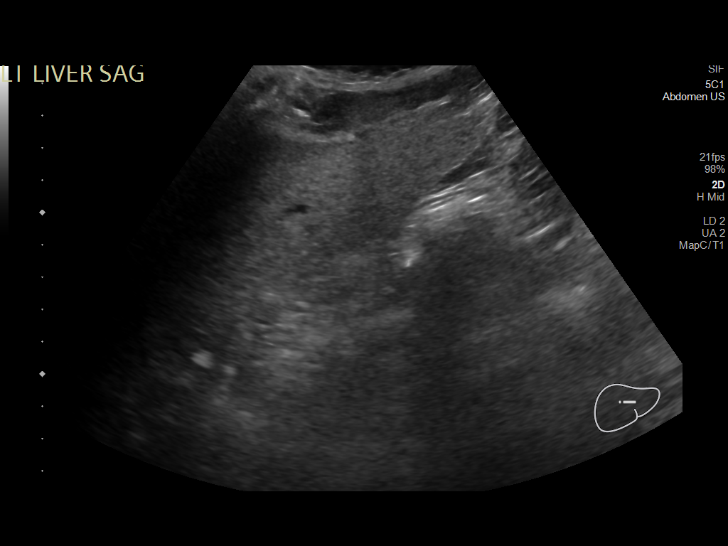
[im 37/50]
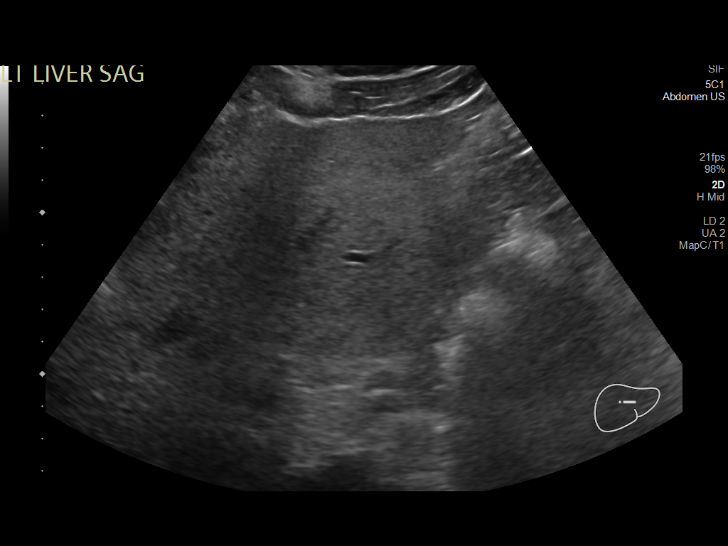
[im 41/50]
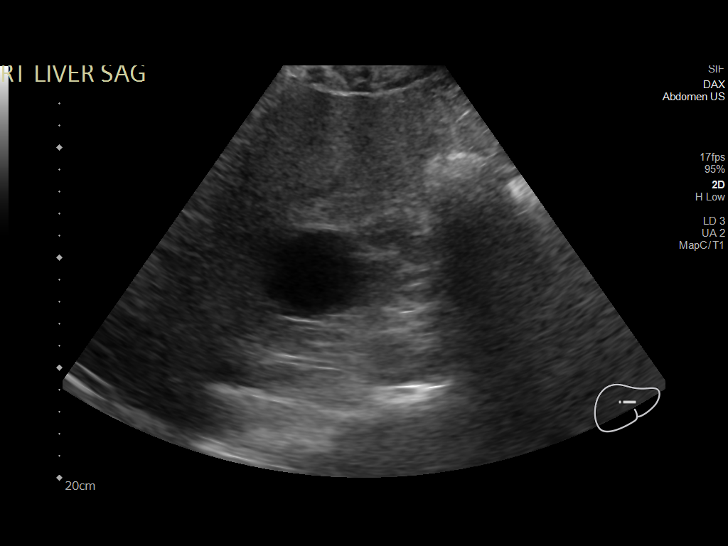
[im 45/50]
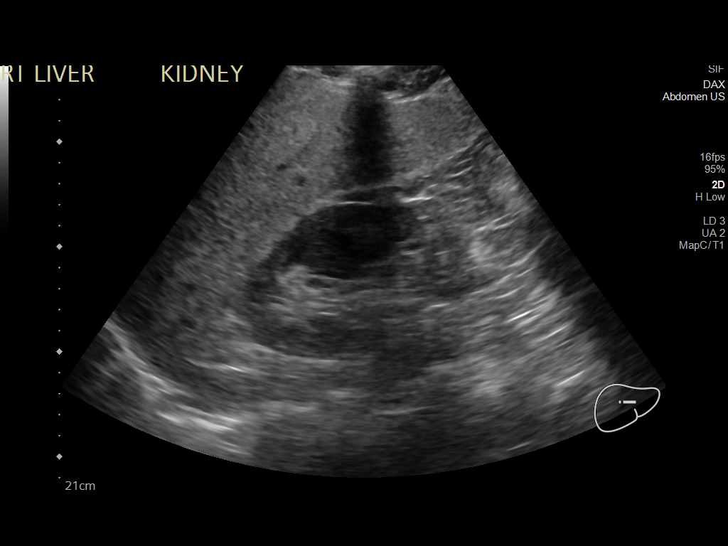
[im 50/50]
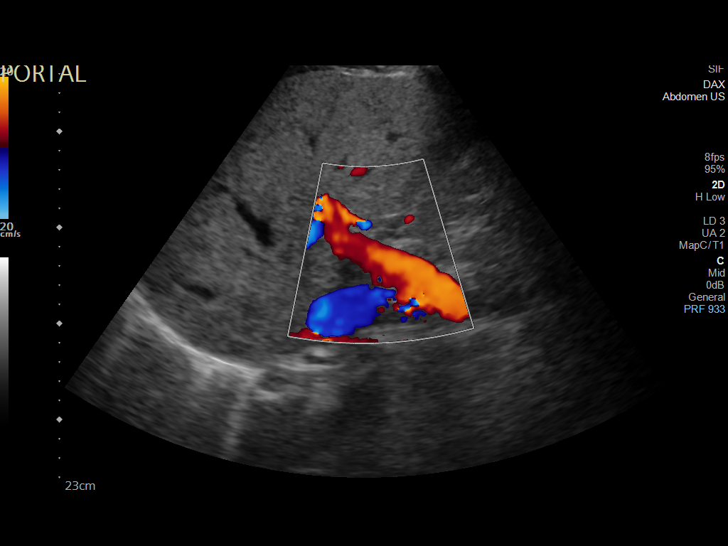

[14 of 25 positions shown; findings below may reference images not displayed]

FINDINGS: Gallbladder:

No gallstones or wall thickening visualized. No sonographic Murphy
sign noted by sonographer.

Common bile duct:

Diameter: 5 mm

Liver:

Heterogeneous increased echotexture of the liver compatible with
fibrofatty infiltration. Relative fatty sparing near the gallbladder
fossa. Portal vein is patent on color Doppler imaging with normal
direction of blood flow towards the liver.

Other: Incidental simple right renal cyst measuring 4.8 cm. No free
fluid.
IMPRESSION: 1. Fatty infiltration of the liver.
2. Simple right renal cyst.

## 2022-06-28 ENCOUNTER — Other Ambulatory Visit: Payer: Self-pay

## 2022-06-28 ENCOUNTER — Emergency Department (HOSPITAL_COMMUNITY): Payer: BC Managed Care – PPO

## 2022-06-28 ENCOUNTER — Observation Stay (HOSPITAL_COMMUNITY)
Admission: EM | Admit: 2022-06-28 | Discharge: 2022-06-29 | Disposition: A | Discharge status: Home / Self Care | Payer: BC Managed Care – PPO | Attending: Family Medicine | Admitting: Family Medicine

## 2022-06-28 ENCOUNTER — Encounter (HOSPITAL_COMMUNITY): Payer: Self-pay

## 2022-06-28 ENCOUNTER — Encounter (HOSPITAL_COMMUNITY): Payer: Self-pay | Admitting: Emergency Medicine

## 2022-06-28 DIAGNOSIS — E1122 Type 2 diabetes mellitus with diabetic chronic kidney disease: Secondary | ICD-10-CM | POA: Diagnosis not present

## 2022-06-28 DIAGNOSIS — R0789 Other chest pain: Secondary | ICD-10-CM | POA: Diagnosis not present

## 2022-06-28 DIAGNOSIS — R9431 Abnormal electrocardiogram [ECG] [EKG]: Secondary | ICD-10-CM | POA: Insufficient documentation

## 2022-06-28 DIAGNOSIS — Z8673 Personal history of transient ischemic attack (TIA), and cerebral infarction without residual deficits: Secondary | ICD-10-CM

## 2022-06-28 DIAGNOSIS — E1165 Type 2 diabetes mellitus with hyperglycemia: Secondary | ICD-10-CM | POA: Diagnosis not present

## 2022-06-28 DIAGNOSIS — Z7984 Long term (current) use of oral hypoglycemic drugs: Secondary | ICD-10-CM | POA: Diagnosis not present

## 2022-06-28 DIAGNOSIS — R7989 Other specified abnormal findings of blood chemistry: Secondary | ICD-10-CM | POA: Insufficient documentation

## 2022-06-28 DIAGNOSIS — R002 Palpitations: Secondary | ICD-10-CM | POA: Diagnosis not present

## 2022-06-28 DIAGNOSIS — Z79899 Other long term (current) drug therapy: Secondary | ICD-10-CM | POA: Insufficient documentation

## 2022-06-28 DIAGNOSIS — I129 Hypertensive chronic kidney disease with stage 1 through stage 4 chronic kidney disease, or unspecified chronic kidney disease: Secondary | ICD-10-CM | POA: Insufficient documentation

## 2022-06-28 DIAGNOSIS — N1832 Chronic kidney disease, stage 3b: Secondary | ICD-10-CM | POA: Insufficient documentation

## 2022-06-28 DIAGNOSIS — E669 Obesity, unspecified: Secondary | ICD-10-CM | POA: Diagnosis present

## 2022-06-28 DIAGNOSIS — R42 Dizziness and giddiness: Secondary | ICD-10-CM | POA: Insufficient documentation

## 2022-06-28 DIAGNOSIS — E039 Hypothyroidism, unspecified: Secondary | ICD-10-CM | POA: Diagnosis not present

## 2022-06-28 DIAGNOSIS — Z87891 Personal history of nicotine dependence: Secondary | ICD-10-CM | POA: Diagnosis not present

## 2022-06-28 DIAGNOSIS — E782 Mixed hyperlipidemia: Secondary | ICD-10-CM | POA: Diagnosis not present

## 2022-06-28 DIAGNOSIS — K219 Gastro-esophageal reflux disease without esophagitis: Secondary | ICD-10-CM

## 2022-06-28 DIAGNOSIS — R0602 Shortness of breath: Secondary | ICD-10-CM | POA: Diagnosis present

## 2022-06-28 DIAGNOSIS — N400 Enlarged prostate without lower urinary tract symptoms: Secondary | ICD-10-CM | POA: Insufficient documentation

## 2022-06-28 DIAGNOSIS — Z7902 Long term (current) use of antithrombotics/antiplatelets: Secondary | ICD-10-CM | POA: Insufficient documentation

## 2022-06-28 DIAGNOSIS — N179 Acute kidney failure, unspecified: Secondary | ICD-10-CM | POA: Diagnosis not present

## 2022-06-28 DIAGNOSIS — I10 Essential (primary) hypertension: Secondary | ICD-10-CM | POA: Diagnosis present

## 2022-06-28 DIAGNOSIS — G4733 Obstructive sleep apnea (adult) (pediatric): Secondary | ICD-10-CM | POA: Diagnosis present

## 2022-06-28 DIAGNOSIS — E876 Hypokalemia: Secondary | ICD-10-CM | POA: Insufficient documentation

## 2022-06-28 DIAGNOSIS — R0609 Other forms of dyspnea: Secondary | ICD-10-CM

## 2022-06-28 LAB — CBC WITH DIFFERENTIAL/PLATELET
Abs Immature Granulocytes: 0.02 10*3/uL (ref 0.00–0.07)
Basophils Absolute: 0 10*3/uL (ref 0.0–0.1)
Basophils Relative: 1 %
Eosinophils Absolute: 0.3 10*3/uL (ref 0.0–0.5)
Eosinophils Relative: 4 %
HCT: 38.1 % — ABNORMAL LOW (ref 39.0–52.0)
Hemoglobin: 12.3 g/dL — ABNORMAL LOW (ref 13.0–17.0)
Immature Granulocytes: 0 %
Lymphocytes Relative: 25 %
Lymphs Abs: 1.8 10*3/uL (ref 0.7–4.0)
MCH: 26.6 pg (ref 26.0–34.0)
MCHC: 32.3 g/dL (ref 30.0–36.0)
MCV: 82.3 fL (ref 80.0–100.0)
Monocytes Absolute: 0.8 10*3/uL (ref 0.1–1.0)
Monocytes Relative: 11 %
Neutro Abs: 4.3 10*3/uL (ref 1.7–7.7)
Neutrophils Relative %: 59 %
Platelets: 245 10*3/uL (ref 150–400)
RBC: 4.63 MIL/uL (ref 4.22–5.81)
RDW: 13.9 % (ref 11.5–15.5)
WBC: 7.2 10*3/uL (ref 4.0–10.5)
nRBC: 0 % (ref 0.0–0.2)

## 2022-06-28 LAB — URINALYSIS, ROUTINE W REFLEX MICROSCOPIC
Bilirubin Urine: NEGATIVE
Glucose, UA: >=500 mg/dL — AB
Hgb urine dipstick: NEGATIVE
Ketones, ur: NEGATIVE mg/dL
Leukocytes,Ua: NEGATIVE
Nitrite: NEGATIVE
Protein, ur: 30 mg/dL — AB
Specific Gravity, Urine: 1.023 (ref 1.005–1.030)
pH: 6 (ref 5.0–8.0)

## 2022-06-28 LAB — COMPREHENSIVE METABOLIC PANEL
ALT: 25 U/L (ref 0–44)
AST: 26 U/L (ref 15–41)
Albumin: 4.1 g/dL (ref 3.5–5.0)
Alkaline Phosphatase: 48 U/L (ref 38–126)
Anion gap: 8 (ref 5–15)
BUN: 17 mg/dL (ref 6–20)
CO2: 24 mmol/L (ref 22–32)
Calcium: 8.5 mg/dL — ABNORMAL LOW (ref 8.9–10.3)
Chloride: 108 mmol/L (ref 98–111)
Creatinine, Ser: 2.1 mg/dL — ABNORMAL HIGH (ref 0.61–1.24)
GFR, Estimated: 35 mL/min — ABNORMAL LOW (ref >60)
Glucose, Bld: 130 mg/dL — ABNORMAL HIGH (ref 70–99)
Potassium: 3 mmol/L — ABNORMAL LOW (ref 3.5–5.1)
Sodium: 140 mmol/L (ref 135–145)
Total Bilirubin: 0.9 mg/dL (ref 0.3–1.2)
Total Protein: 6.9 g/dL (ref 6.5–8.1)

## 2022-06-28 LAB — D-DIMER, QUANTITATIVE: D-Dimer, Quant: 0.3 ug/mL-FEU (ref 0.00–0.50)

## 2022-06-28 LAB — TROPONIN I (HIGH SENSITIVITY)
Troponin I (High Sensitivity): 7 ng/L (ref <18)
Troponin I (High Sensitivity): 8 ng/L (ref <18)

## 2022-06-28 LAB — BRAIN NATRIURETIC PEPTIDE: B Natriuretic Peptide: 13 pg/mL (ref 0.0–100.0)

## 2022-06-28 MED ORDER — SODIUM CHLORIDE 0.9 % IV BOLUS
500.0000 mL | Freq: Once | INTRAVENOUS | Status: AC
  Administered 2022-06-28: 500 mL via INTRAVENOUS

## 2022-06-28 MED ORDER — HYDRALAZINE HCL 25 MG PO TABS
25.0000 mg | ORAL_TABLET | Freq: Three times a day (TID) | ORAL | Status: DC
  Administered 2022-06-28 – 2022-06-29 (×2): 25 mg via ORAL
  Filled 2022-06-28 (×2): qty 1

## 2022-06-28 NOTE — ED Notes (Signed)
Patient transported to CT 

## 2022-06-28 NOTE — ED Provider Notes (Addendum)
[facility] EMERGENCY DEPARTMENT Provider Note   CSN: [id number] Arrival date & time: 06/28/22  1614     History Chief Complaint  Patient presents with   Shortness of Breath   Dizziness    Eric Glass is a 61 y.o. male patient with history of stroke on Plavix, type 2 diabetes, OSA, hypertension who presents to the emergency department today for further evaluation of an abnormal EKG, exertional chest pain, and shortness of breath.  This has been ongoing for last 4 to 5 days.  Patient was seen evaluated at his PCP for the symptoms and had abnormal ST pattern on his EKG and was sent to the ED for further evaluation.  He states that every time he exerts himself he gets chest pain and shortness of breath.  Both the symptoms resolve with rest.  He states he is unable to lay flat secondary to shortness of breath.  He denies any leg swelling, PND, fever, chills.  In addition, around 2 PM today patient began having tingliness to his right lower extremity.  He states that this is different from when the patient had both of his strokes in the past.  He has not missed any doses of his Plavix.  He denies any weakness in the right lower extremity.  No other focal weakness or numbness to the other extremities.  No facial droop, trouble talking.   Shortness of Breath Dizziness Associated symptoms: shortness of breath        Home Medications Prior to Admission medications   Medication Sig Start Date End Date Taking? Authorizing Provider  alfuzosin (UROXATRAL) 10 MG 24 hr tablet Take 10 mg by mouth daily with breakfast.    [provider]  amLODipine-benazepril (LOTREL) 10-20 MG per capsule Take 1 capsule by mouth daily.    [provider]  atorvastatin (LIPITOR) 10 MG tablet Take 10 mg by mouth every evening.  03/26/15   [provider]  Blood Glucose Monitoring Suppl (ACCU-CHEK AVIVA) device Use as instructed Patient taking differently: 1 each by Other route daily.   11/20/17   Roma Kayser, MD  Cholecalciferol (VITAMIN D) 125 MCG (5000 UT) CAPS Take 1 capsule by mouth daily.    [provider]  clopidogrel (PLAVIX) 75 MG tablet TAKE ONE TABLET BY MOUTH ONCE DAILY Patient taking differently: Take 75 mg by mouth daily.  04/02/16   Marvel Plan, MD  DEXILANT 60 MG capsule Take 60 mg by mouth daily.  03/04/17   [provider]  glipiZIDE (GLUCOTROL) 5 MG tablet Take 1 tablet (5 mg total) by mouth 2 (two) times daily before a meal. 08/26/18   Nida, Denman George, MD  glucose blood (ACCU-CHEK AVIVA) test strip Use to test blood glucose 2 times a day. Patient taking differently: 1 each by Other route in the morning and at bedtime.  11/20/17   Roma Kayser, MD  levothyroxine (SYNTHROID) 100 MCG tablet Take 1 tablet (100 mcg total) by mouth daily before breakfast. 03/17/19   Nida, Denman George, MD  metFORMIN (GLUCOPHAGE) 1000 MG tablet Take 1 tablet (1,000 mg total) by mouth See admin instructions. 2 tablets (1000 mg) in the morning, and 1 tablet (500 mg) every evening 05/26/18   Roma Kayser, MD  metoprolol succinate (TOPROL-XL) 100 MG 24 hr tablet Take 100 mg by mouth daily.  01/30/16   [provider]  ondansetron (ZOFRAN ODT) 4 MG disintegrating tablet Take 1 tablet (4 mg total) by mouth every 6 (six)  hours as needed. 11/25/19   Ward, Delice Bison, DO  oxyCODONE-acetaminophen (PERCOCET/ROXICET) 5-325 MG tablet Take 2 tablets by mouth every 6 (six) hours as needed. 11/25/19   Ward, Delice Bison, DO  pantoprazole (PROTONIX) 40 MG tablet Take 1 tablet (40 mg total) by mouth daily. 11/25/19   Ward, Delice Bison, DO  Semaglutide,0.25 or 0.5MG /DOS, (OZEMPIC, 0.25 OR 0.5 MG/DOSE,) 2 MG/1.5ML SOPN Inject into the skin once a week.    [provider]      Allergies    Bee venom and Contrast media [iodinated contrast media]    Review of Systems   Review of Systems  Respiratory:  Positive for shortness of breath.    Neurological:  Positive for dizziness.  All other systems reviewed and are negative.   Physical Exam Updated Vital Signs BP (!) 140/85 (BP Location: Right Arm)   Pulse (!) 106   Temp 98.5 F (36.9 C)   Resp 18   SpO2 99%  Physical Exam Vitals and nursing note reviewed.  Constitutional:      General: He is not in acute distress.    Appearance: Normal appearance.  HENT:     Head: Normocephalic and atraumatic.  Eyes:     General:        Right eye: No discharge.        Left eye: No discharge.  Cardiovascular:     Comments: Regular rate and rhythm.  S1/S2 are distinct without any evidence of murmur, rubs, or gallops.  Radial pulses are 2+ bilaterally.  Dorsalis pedis pulses are 2+ bilaterally.  No evidence of pedal edema. Pulmonary:     Comments: Clear to auscultation bilaterally.  Normal effort.  No respiratory distress.  No evidence of wheezes, rales, or rhonchi heard throughout. Abdominal:     General: Abdomen is flat. Bowel sounds are normal. There is no distension.     Tenderness: There is no abdominal tenderness. There is no guarding or rebound.  Musculoskeletal:        General: Normal range of motion.     Cervical back: Neck supple.  Skin:    General: Skin is warm and dry.     Findings: No rash.  Neurological:     General: No focal deficit present.     Mental Status: He is alert.     Comments: Cranial nerves II through XII are intact.  No signs of dysmetria to finger-nose.  Pupils are equal round reactive to light.  Patient is talking in complete sentences.  5/5 strength to the upper extremities.  No pronator drift.  There is slight decreased strength to resistance with flexion of the hip on the right lower extremity.  Slight decreased subjective sensation along the right shin in comparison to the left.  Psychiatric:        Mood and Affect: Mood normal.        Behavior: Behavior normal.     ED Results / Procedures / Treatments   Labs (all labs ordered are listed,  but only abnormal results are displayed) Labs Reviewed  CBC WITH DIFFERENTIAL/PLATELET  COMPREHENSIVE METABOLIC PANEL  BRAIN NATRIURETIC PEPTIDE  D-DIMER, QUANTITATIVE  TROPONIN I (HIGH SENSITIVITY)    EKG EKG Interpretation  Date/Time:  Thursday June 28 2022 16:29:41 EDT Ventricular Rate:  102 PR Interval:  154 QRS Duration: 82 QT Interval:  394 QTC Calculation: 513 R Axis:   87 Text Interpretation: Sinus tachycardia Otherwise normal ECG When compared with ECG of 25-Nov-2019 13:39, Incomplete right bundle branch  block is no longer Present Borderline criteria for Anterior infarct are no longer Present QT has lengthened Confirmed by Linwood Dibbles [id number]) on 06/28/2022 4:55:53 PM  Radiology DG Chest 2 View  Result Date: 06/28/2022 CLINICAL DATA:  Shortness of breath, RIGHT leg numbness, dizziness, shortness of breath with activity for 3 days EXAM: CHEST - 2 VIEW COMPARISON:  02/22/2021 FINDINGS: Normal heart size, mediastinal contours, and pulmonary vascularity. Lungs clear. No pleural effusion or pneumothorax. Bones unremarkable. IMPRESSION: Normal exam. Electronically Signed   By: Ulyses Southward M.D.   On: 06/28/2022 16:58    Procedures Procedures    Medications Ordered in ED Medications - No data to display  ED Course/ Medical Decision Making/ A&P                           Medical Decision Making CAIDON FOTI is a 61 y.o. male patient who presents to the emergency department today for further evaluation of exertional chest pain, shortness of breath and right lower extremity numbness.  Respect to the chest pain and shortness of breath, I will add on BNP and cardiac enzymes.  We will also get EKG and chest x-ray.  Patient is slightly tachycardic here and I will add on D-dimer.  Patient does have a contrast allergy.  With respect to the right lower extremity numbness, do not feel that code stroke needs to be activated at this time considering that there is no true weakness in the  right lower extremity.  We will get a CT head to further assess.  Patient is anticoagulated as well.   This to the patient's work-up is still pending.  D-dimer is normal thus making pulmonary embolism less likely.  CT head was normal.  Chest x-ray is clear.  Depending on the rest of his work-up the patient might need contact with cardiology.  This will all be depending on the troponin level. I would favor admission as the patient does have somewhat of a concerning story. Due to shift change, the rest of the patient's care will be transferred to [facility] where ultimate disposition will be made.  Amount and/or Complexity of Data Reviewed Labs: ordered. Radiology: ordered.    Final Clinical Impression(s) / ED Diagnoses Final diagnoses:  None    Rx / DC Orders ED Discharge Orders     None         Teressa Lower, PA-C 06/28/22 1902    Teressa Lower, PA-C 06/28/22 Julian Reil    Linwood Dibbles, MD 06/29/22 [phone]

## 2022-06-28 NOTE — H&P (Addendum)
History and Physical    Patient: Eric Glass TWS:[id number] DOB: December 24, 1960 DOA: 06/28/2022 DOS: the patient was seen and examined on 06/29/2022 PCP: Ignatius Specking, MD  Patient coming from: Home  Chief Complaint:  Chief Complaint  Patient presents with   Shortness of Breath   Dizziness   HPI: Eric Glass is a 61 y.o. male with medical history significant of history of stroke on Plavix, essential hypertension, hyperlipidemia, GERD, hypothyroidism, BPH, type 2 diabetes mellitus, sleep apnea (noncompliant with CPAP) who presents to the emergency department due to chest pain on exertion that has been ongoing for about 4 days.  He states that he had left-sided chest pain about 4 days ago, pain was sharp and only lasted about 5 minutes and self resolved.  However, he has felt weak and did not go to work on Monday (10/30).  He complained of chest pain and shortness of breath whenever he exerts himself, while at work today, he had palpitations after exerting himself, he followed up with his PCP who did an EKG and noted an abnormal ST pattern, so he was sent to the ED for further evaluation. Patient also complained of right leg numbness which was different from symptoms felt when he had stroke.  However, this already resolved at bedside.  He denies any weakness in the leg and denies slurred speech or facial droop.  ED Course:  In the emergency department, he was initially tachycardic on arrival with HR of 106/min, BP was 140/85, but other vital signs were within normal range.  Work-up in the ED showed normocytic anemia, BMP showed sodium 140, potassium 3.0, chloride 108, bicarb 24, glucose 130, BUN 17, creatinine 2.10 (baseline creatinine at 1.2-1.3).  D-dimer 0.30, troponin x2 was negative, BNP 13.0, urinalysis was positive for glycosuria and proteinuria. IV hydration was provided.  Cardiology was consulted and recommended continuing with Isordil, beta-blocker and to hold Dilantin and ACE inhibitors  due to kidney injury.  Hydralazine was ordered.  Hospitalist was asked to admit patient for further evaluation and management.   Review of Systems: Review of systems as noted in the HPI. All other systems reviewed and are negative.   Past Medical History:  Diagnosis Date   Arthritis    Chest pain    CVA (cerebral infarction)    Diabetes mellitus without complication (HCC)    Headache    Hypertension    Neck pain    Stroke [facility])    Past Surgical History:  Procedure Laterality Date   CARDIAC CATHETERIZATION N/A 05/01/2016   Procedure: Left Heart Cath and Coronary Angiography;  Surgeon: Yates Decamp, MD;  Location: [facility] INVASIVE CV LAB;  Service: Cardiovascular;  Laterality: N/A;   rotator cuff replacement Left     Social History:  reports that he quit smoking about 7 years ago. He has never used smokeless tobacco. He reports that he does not drink alcohol and does not use drugs.   Allergies  Allergen Reactions   Bee Venom Anaphylaxis    EPIPEN required   Contrast Media [Iodinated Contrast Media] Nausea Only    Family History  Problem Relation Age of Onset   Stroke Father    Heart disease Father    Seizures Paternal Uncle      Prior to Admission medications   Medication Sig Start Date End Date Taking? Authorizing Provider  alfuzosin (UROXATRAL) 10 MG 24 hr tablet Take 10 mg by mouth daily with breakfast.   Yes [provider]  amLODipine-benazepril (LOTREL)  10-40 MG capsule 1 capsule. 12/14/20  Yes [provider]  atorvastatin (LIPITOR) 10 MG tablet Take 10 mg by mouth every evening.  03/26/15  Yes [provider]  chlorthalidone (HYGROTON) 25 MG tablet Take 25 mg by mouth daily. 06/20/22  Yes [provider]  clopidogrel (PLAVIX) 75 MG tablet TAKE ONE TABLET BY MOUTH ONCE DAILY Patient taking differently: Take 75 mg by mouth daily. 04/02/16  Yes Rosalin Hawking, MD  glipiZIDE (GLUCOTROL) 5 MG tablet Take 1 tablet (5 mg total) by mouth 2 (two)  times daily before a meal. 08/26/18  Yes Nida, Marella Chimes, MD  JANUVIA 100 MG tablet Take 100 mg by mouth daily. 06/20/22  Yes [provider]  JARDIANCE 25 MG TABS tablet Take 25 mg by mouth daily.   Yes [provider]  levocetirizine (XYZAL) 5 MG tablet Take 5 mg by mouth daily. 06/06/22  Yes [provider]  levothyroxine (SYNTHROID) 125 MCG tablet Take 125 mcg by mouth daily.   Yes [provider]  metFORMIN (GLUCOPHAGE) 1000 MG tablet Take 1 tablet (1,000 mg total) by mouth See admin instructions. 2 tablets (1000 mg) in the morning, and 1 tablet (500 mg) every evening 05/26/18  Yes Nida, Marella Chimes, MD  metoprolol succinate (TOPROL-XL) 100 MG 24 hr tablet Take 100 mg by mouth daily.  01/30/16  Yes [provider]  pantoprazole (PROTONIX) 40 MG tablet Take 1 tablet (40 mg total) by mouth daily. 11/25/19  Yes Ward, Delice Bison, DO  potassium chloride (MICRO-K) 10 MEQ CR capsule Take 10 mEq by mouth daily. 05/31/22  Yes [provider]  promethazine-dextromethorphan (PROMETHAZINE-DM) 6.25-15 MG/5ML syrup Take 5 mLs by mouth every 4 (four) hours as needed. 03/16/22  Yes [provider]  Blood Glucose Monitoring Suppl (ACCU-CHEK AVIVA) device Use as instructed Patient taking differently: 1 each by Other route daily. 11/20/17   Cassandria Anger, MD  glucose blood (ACCU-CHEK AVIVA) test strip Use to test blood glucose 2 times a day. Patient taking differently: 1 each by Other route in the morning and at bedtime. 11/20/17   Cassandria Anger, MD  ondansetron (ZOFRAN ODT) 4 MG disintegrating tablet Take 1 tablet (4 mg total) by mouth every 6 (six) hours as needed. Patient not taking: Reported on 06/28/2022 11/25/19   Ward, Delice Bison, DO    Physical Exam: BP (!) 144/78 (BP Location: Right Arm)   Pulse 87   Temp 98.3 F (36.8 C) (Oral)   Resp 16   Ht 6' (1.829 m)   Wt 104.9 kg   SpO2 99%   BMI 31.36 kg/m   General: 61 y.o.  year-old male well developed well nourished in no acute distress.  Alert and oriented x3. HEENT: NCAT, EOMI Neck: Supple, trachea medial Cardiovascular: Regular rate and rhythm with no rubs or gallops.  No thyromegaly or JVD noted.  No lower extremity edema. 2/4 pulses in all 4 extremities. Respiratory: Clear to auscultation with no wheezes or rales. Good inspiratory effort. Abdomen: Soft, nontender nondistended with normal bowel sounds x4 quadrants. Muskuloskeletal: No cyanosis, clubbing or edema noted bilaterally Neuro: CN II-XII intact, strength 5/5 x 4, sensation, reflexes intact Skin: No ulcerative lesions noted or rashes Psychiatry: Judgement and insight appear normal. Mood is appropriate for condition and setting          Labs on Admission:  Basic Metabolic Panel: Recent Labs  Lab 06/28/22 1801  NA 140  K 3.0*  CL 108  CO2 24  GLUCOSE 130*  BUN 17  CREATININE 2.10*  CALCIUM 8.5*   Liver Function Tests: Recent Labs  Lab 06/28/22 1801  AST 26  ALT 25  ALKPHOS 48  BILITOT 0.9  PROT 6.9  ALBUMIN 4.1   No results for input(s): "LIPASE", "AMYLASE" in the last 168 hours. No results for input(s): "AMMONIA" in the last 168 hours. CBC: Recent Labs  Lab 06/28/22 1801  WBC 7.2  NEUTROABS 4.3  HGB 12.3*  HCT 38.1*  MCV 82.3  PLT 245   Cardiac Enzymes: No results for input(s): "CKTOTAL", "CKMB", "CKMBINDEX", "TROPONINI" in the last 168 hours.  BNP (last 3 results) Recent Labs    06/28/22 1801  BNP 13.0    ProBNP (last 3 results) No results for input(s): "PROBNP" in the last 8760 hours.  CBG: No results for input(s): "GLUCAP" in the last 168 hours.  Radiological Exams on Admission: CT Head Wo Contrast  Result Date: 06/28/2022 CLINICAL DATA:  No logic deficit. EXAM: CT HEAD WITHOUT CONTRAST TECHNIQUE: Contiguous axial images were obtained from the base of the skull through the vertex without intravenous contrast. RADIATION DOSE REDUCTION: This exam was  performed according to the departmental dose-optimization program which includes automated exposure control, adjustment of the mA and/or kV according to patient size and/or use of iterative reconstruction technique. COMPARISON:  None Available. FINDINGS: Brain: The ventricles and sulci are appropriate size for the patient's age. The gray-white matter discrimination is preserved. There is no acute intracranial hemorrhage. No mass effect or midline shift. No extra-axial fluid collection. Vascular: No hyperdense vessel or unexpected calcification. Skull: Normal. Negative for fracture or focal lesion. Sinuses/Orbits: There is mild diffuse mucoperiosteal thickening of paranasal sinuses. No air-fluid level the mastoid air cells are clear. Other: None IMPRESSION: 1. No acute intracranial pathology. 2. Mild paranasal sinus disease. Electronically Signed   By: Elgie Collard M.D.   On: 06/28/2022 17:54   DG Chest 2 View  Result Date: 06/28/2022 CLINICAL DATA:  Shortness of breath, RIGHT leg numbness, dizziness, shortness of breath with activity for 3 days EXAM: CHEST - 2 VIEW COMPARISON:  02/22/2021 FINDINGS: Normal heart size, mediastinal contours, and pulmonary vascularity. Lungs clear. No pleural effusion or pneumothorax. Bones unremarkable. IMPRESSION: Normal exam. Electronically Signed   By: Ulyses Southward M.D.   On: 06/28/2022 16:58    EKG: I independently viewed the EKG done and my findings are as followed: Sinus tachycardia at a rate of 112 bpm with QTc 513 ms  Assessment/Plan Present on Admission:  Atypical chest pain  Obesity  Obstructive sleep apnea  Essential hypertension  Uncontrolled type 2 diabetes mellitus with hyperglycemia (HCC)  Mixed hyperlipidemia  Principal Problem:   Atypical chest pain Active Problems:   Obstructive sleep apnea   Obesity   Mixed hyperlipidemia   Essential hypertension   History of stroke   Uncontrolled type 2 diabetes mellitus with hyperglycemia (HCC)    Prolonged QT interval   BPH (benign prostatic hyperplasia)   Acquired hypothyroidism  Atypical Chest Pain Cardiovascular risk factors include hypertension, hyperlipidemia, T2DM, obesity Continue telemetry  Troponins x2 - 7 > 8 EKG showed Sinus tachycardia at a rate of 112 bpm with QTc 513 ms Continue Isodril 20 mg 3 times daily as recommended by cardiologist on call (Dr. Odis Hollingshead), continue Toprol  aspirin Cardiology will be consulted to help decide if Stress test is needed in am Versus other  diagnostic modalities.   Hypokalemia K+ 3.0, this will be replenished  T2DM with hyperglycemia Continue ISS  and hypoglycemic protocol  Acute kidney injury Creatinine 2.10 (baseline creatinine at 1.2-1.3). Renally adjust medications, avoid nephrotoxic agents/dehydration/hypotension  Prolonged QT interval QTc 513 ms Avoid QT prolonging drugs Magnesium level will be checked Continue telemetry  History of stroke on Plavix Continue Plavix, Lipitor  Essential hypertension Continue Toprol-XL  Mixed hyperlipidemia Continue Lipitor  GERD Continue Protonix  Acquired hypothyroidism Continue Synthroid  BPH Continue alfuzosin  OSA on CPAP Patient was noncompliant with CPAP  Obesity (BMI 31.36) Diet and lifestyle modification  DVT prophylaxis: Lovenox  Code Status: Full code  Family Communication: Wife at bedside (all questions answered to satisfaction)  Consults: Cardiology  Severity of Illness: The appropriate patient status for this patient is OBSERVATION. Observation status is judged to be reasonable and necessary in order to provide the required intensity of service to ensure the patient's safety. The patient's presenting symptoms, physical exam findings, and initial radiographic and laboratory data in the context of their medical condition is felt to place them at decreased risk for further clinical deterioration. Furthermore, it is anticipated that the patient will be  medically stable for discharge from the hospital within 2 midnights of admission.   Author: Frankey Shown, DO 06/29/2022 2:31 AM  For on call review [email or web address].

## 2022-06-28 NOTE — ED Notes (Signed)
ED Provider at bedside. 

## 2022-06-28 NOTE — ED Provider Notes (Addendum)
Physical Exam  BP 132/88   Pulse 82   Temp 98.5 F (36.9 C)   Resp 15   SpO2 99%   Physical Exam Vitals and nursing note reviewed.  Constitutional:      General: He is not in acute distress.    Appearance: He is not ill-appearing or toxic-appearing.  Cardiovascular:     Rate and Rhythm: Normal rate.     Pulses: Normal pulses.  Pulmonary:     Effort: Pulmonary effort is normal. No respiratory distress.     Breath sounds: Normal breath sounds.  Chest:     Chest wall: No tenderness.  Musculoskeletal:     Right lower leg: No edema.     Left lower leg: No edema.  Skin:    General: Skin is warm and dry.     Coloration: Skin is not cyanotic or pale.  Neurological:     Mental Status: He is alert and oriented to person, place, and time.     GCS: GCS eye subscore is 4. GCS verbal subscore is 5. GCS motor subscore is 6.     Cranial Nerves: No dysarthria or facial asymmetry.     Sensory: No sensory deficit.     Motor: No tremor.     Comments: Sensation of lower extremities appears grossly intact  Psychiatric:        Mood and Affect: Mood normal.        Behavior: Behavior normal.     Procedures  Procedures  ED Course / MDM   Clinical Course as of 06/28/22 2336  Thu Jun 28, 2022  2028 Consulted with Dr. Terri Skains of Cardiology.  Discussed patient's history in detail.  Reviewed prior cardiology notes from previous echo, previous cardiac catheterization, and patient's encounter today.  Patient's overall work-up nonconcerning with the exception of creatinine 2.10.  BNP 13, exam not suggestive of acute CHF.  D-dimer negative.  History suspicious for ACS with significant risk factors.  If patient is okay with admission, recommends admission to medicine with transfer to [facility] for further evaluation, or difficulty with bed placement may be rounded on by [facility] at [facility].  Regardless recommends continuing with echo and stress test.  If patient has aversion to admission, may follow-up  closely with cardiology.  Also recommends stopping the  Lotrel and chlorthalidone to help kidney function, and start hydralazine 25 mg 3 times daily and Imdur 20 mg 3 times daily (?) for antianginal assistance.  [AC]  2037 D-Dimer, Quant: 0.30 [AC]  2216 Attempted to order the Imdur, however in 24 hour extended release in dosages that do not correlate with 20mg  options.  Ordered hydralazine, did not order the Imdur.  Dr. Josephine Cables and Dr. Terri Skains notified. [AC]    Clinical Course User Index [AC] Prince Rome, PA-C   Medical Decision Making Amount and/or Complexity of Data Reviewed Labs: ordered. Decision-making details documented in ED Course. Radiology: ordered.  Risk Prescription drug management. Decision regarding hospitalization.   Results for orders placed or performed during the hospital encounter of 06/28/22  CBC with Differential  Result Value Ref Range   WBC 7.2 4.0 - 10.5 K/uL   RBC 4.63 4.22 - 5.81 MIL/uL   Hemoglobin 12.3 (L) 13.0 - 17.0 g/dL   HCT 38.1 (L) 39.0 - 52.0 %   MCV 82.3 80.0 - 100.0 fL   MCH 26.6 26.0 - 34.0 pg   MCHC 32.3 30.0 - 36.0 g/dL   RDW 13.9 11.5 - 15.5 %  Platelets 245 150 - 400 K/uL   nRBC 0.0 0.0 - 0.2 %   Neutrophils Relative % 59 %   Neutro Abs 4.3 1.7 - 7.7 K/uL   Lymphocytes Relative 25 %   Lymphs Abs 1.8 0.7 - 4.0 K/uL   Monocytes Relative 11 %   Monocytes Absolute 0.8 0.1 - 1.0 K/uL   Eosinophils Relative 4 %   Eosinophils Absolute 0.3 0.0 - 0.5 K/uL   Basophils Relative 1 %   Basophils Absolute 0.0 0.0 - 0.1 K/uL   Immature Granulocytes 0 %   Abs Immature Granulocytes 0.02 0.00 - 0.07 K/uL  Comprehensive metabolic panel  Result Value Ref Range   Sodium 140 135 - 145 mmol/L   Potassium 3.0 (L) 3.5 - 5.1 mmol/L   Chloride 108 98 - 111 mmol/L   CO2 24 22 - 32 mmol/L   Glucose, Bld 130 (H) 70 - 99 mg/dL   BUN 17 6 - 20 mg/dL   Creatinine, Ser 2.10 (H) 0.61 - 1.24 mg/dL   Calcium 8.5 (L) 8.9 - 10.3 mg/dL   Total Protein 6.9  6.5 - 8.1 g/dL   Albumin 4.1 3.5 - 5.0 g/dL   AST 26 15 - 41 U/L   ALT 25 0 - 44 U/L   Alkaline Phosphatase 48 38 - 126 U/L   Total Bilirubin 0.9 0.3 - 1.2 mg/dL   GFR, Estimated 35 (L) >60 mL/min   Anion gap 8 5 - 15  Brain natriuretic peptide  Result Value Ref Range   B Natriuretic Peptide 13.0 0.0 - 100.0 pg/mL  D-dimer, quantitative  Result Value Ref Range   D-Dimer, Quant 0.30 0.00 - 0.50 ug/mL-FEU  Troponin I (High Sensitivity)  Result Value Ref Range   Troponin I (High Sensitivity) 7 <18 ng/L  Troponin I (High Sensitivity)  Result Value Ref Range   Troponin I (High Sensitivity) 8 <18 ng/L   CT Head Wo Contrast  Result Date: 06/28/2022 CLINICAL DATA:  No logic deficit. EXAM: CT HEAD WITHOUT CONTRAST TECHNIQUE: Contiguous axial images were obtained from the base of the skull through the vertex without intravenous contrast. RADIATION DOSE REDUCTION: This exam was performed according to the departmental dose-optimization program which includes automated exposure control, adjustment of the mA and/or kV according to patient size and/or use of iterative reconstruction technique. COMPARISON:  None Available. FINDINGS: Brain: The ventricles and sulci are appropriate size for the patient's age. The gray-white matter discrimination is preserved. There is no acute intracranial hemorrhage. No mass effect or midline shift. No extra-axial fluid collection. Vascular: No hyperdense vessel or unexpected calcification. Skull: Normal. Negative for fracture or focal lesion. Sinuses/Orbits: There is mild diffuse mucoperiosteal thickening of paranasal sinuses. No air-fluid level the mastoid air cells are clear. Other: None IMPRESSION: 1. No acute intracranial pathology. 2. Mild paranasal sinus disease. Electronically Signed   By: Anner Crete M.D.   On: 06/28/2022 17:54   DG Chest 2 View  Result Date: 06/28/2022 CLINICAL DATA:  Shortness of breath, RIGHT leg numbness, dizziness, shortness of breath  with activity for 3 days EXAM: CHEST - 2 VIEW COMPARISON:  02/22/2021 FINDINGS: Normal heart size, mediastinal contours, and pulmonary vascularity. Lungs clear. No pleural effusion or pneumothorax. Bones unremarkable. IMPRESSION: Normal exam. Electronically Signed   By: Lavonia Dana M.D.   On: 06/28/2022 16:58     Patient signed out to me at shift change.  Please see previous provider note for further details.  In short, this is a 70  yom with concerning story for ACS.  Sent by PCP for abnormal EKG.  Originally presented with chief complaint of dyspnea on exertion over last 4-5 days at PCP office.  Relieves with rest.  Feels similar to prior chest pain/DOE that resulted in cardiac workup, though pt states it feels worse this time than prior events.  Currently on plavix, prior hx of 2 CVAs.  Had initially described some "sensation changes" to the right lower leg, however upon further discussion states this comes and goes for seconds at a time.  Without active sensation changes at this time.  Denies use of erectile dysfunction medications.  Workup:  Troponin 7, 8.  D-dimer neg. BNP 13.0  CT head negative Mild elevated creatinine of 2.10 -- with hx of poorly controlled DMT2 and pt states he was told his creatinine was elevated during routine labs 2-3 months prior as well.  No urinary symptoms or suprapubic tenderness HEART SCORE 6  Consultations Dr. Dema Severin of cardiology, see note above.  Discussed cardiology recommendations with pt, shared decision making.  Pt has elected to stay for further cardiology workup.  Low suspicion of CHF presentation.   Dr. Josephine Cables of hospitalist team.  Discussed pt case and cardiology recommendations.  Agrees with plan for admission.  Plan --Admission  This chart was dictated using voice recognition software.  Despite best efforts to proofread, errors can occur which can change the documentation meaning.     Prince Rome, PA-C [id number]    Prince Rome, PA-C [id number]    Prince Rome, PA-C [id number]    Dorie Rank, MD 06/29/22 [phone]

## 2022-06-28 NOTE — ED Triage Notes (Signed)
R leg numbness, dizziness and sob with activity x 3 days. Seen pcp today, done EKG and sent to ED due to EKG abnormality showing ST. Pt c/o some dizziness now and feels like his pulse is high. Color wnl. No resp distress or sob noted in triage. Non diaphoretic

## 2022-06-29 ENCOUNTER — Observation Stay (HOSPITAL_BASED_OUTPATIENT_CLINIC_OR_DEPARTMENT_OTHER): Payer: BC Managed Care – PPO

## 2022-06-29 ENCOUNTER — Other Ambulatory Visit (HOSPITAL_COMMUNITY): Payer: Self-pay | Admitting: *Deleted

## 2022-06-29 DIAGNOSIS — E039 Hypothyroidism, unspecified: Secondary | ICD-10-CM | POA: Insufficient documentation

## 2022-06-29 DIAGNOSIS — E785 Hyperlipidemia, unspecified: Secondary | ICD-10-CM | POA: Diagnosis not present

## 2022-06-29 DIAGNOSIS — E876 Hypokalemia: Secondary | ICD-10-CM | POA: Insufficient documentation

## 2022-06-29 DIAGNOSIS — R079 Chest pain, unspecified: Secondary | ICD-10-CM

## 2022-06-29 DIAGNOSIS — I10 Essential (primary) hypertension: Secondary | ICD-10-CM | POA: Diagnosis not present

## 2022-06-29 DIAGNOSIS — R0789 Other chest pain: Secondary | ICD-10-CM | POA: Diagnosis not present

## 2022-06-29 DIAGNOSIS — R002 Palpitations: Secondary | ICD-10-CM

## 2022-06-29 DIAGNOSIS — N183 Chronic kidney disease, stage 3 unspecified: Secondary | ICD-10-CM

## 2022-06-29 DIAGNOSIS — R9431 Abnormal electrocardiogram [ECG] [EKG]: Secondary | ICD-10-CM | POA: Insufficient documentation

## 2022-06-29 DIAGNOSIS — N1832 Chronic kidney disease, stage 3b: Secondary | ICD-10-CM | POA: Diagnosis present

## 2022-06-29 DIAGNOSIS — N400 Enlarged prostate without lower urinary tract symptoms: Secondary | ICD-10-CM | POA: Insufficient documentation

## 2022-06-29 LAB — GLUCOSE, CAPILLARY
Glucose-Capillary: 145 mg/dL — ABNORMAL HIGH (ref 70–99)
Glucose-Capillary: 117 mg/dL — ABNORMAL HIGH (ref 70–99)
Glucose-Capillary: 204 mg/dL — ABNORMAL HIGH (ref 70–99)

## 2022-06-29 LAB — MAGNESIUM
Magnesium: 1.6 mg/dL — ABNORMAL LOW (ref 1.7–2.4)
Magnesium: 2.1 mg/dL (ref 1.7–2.4)

## 2022-06-29 LAB — COMPREHENSIVE METABOLIC PANEL
ALT: 23 U/L (ref 0–44)
AST: 25 U/L (ref 15–41)
Albumin: 3.8 g/dL (ref 3.5–5.0)
Alkaline Phosphatase: 45 U/L (ref 38–126)
Anion gap: 11 (ref 5–15)
BUN: 17 mg/dL (ref 6–20)
CO2: 25 mmol/L (ref 22–32)
Calcium: 8.7 mg/dL — ABNORMAL LOW (ref 8.9–10.3)
Chloride: 105 mmol/L (ref 98–111)
Creatinine, Ser: 1.76 mg/dL — ABNORMAL HIGH (ref 0.61–1.24)
GFR, Estimated: 44 mL/min — ABNORMAL LOW (ref >60)
Glucose, Bld: 130 mg/dL — ABNORMAL HIGH (ref 70–99)
Potassium: 2.9 mmol/L — ABNORMAL LOW (ref 3.5–5.1)
Sodium: 141 mmol/L (ref 135–145)
Total Bilirubin: 0.6 mg/dL (ref 0.3–1.2)
Total Protein: 6.7 g/dL (ref 6.5–8.1)

## 2022-06-29 LAB — HEMOGLOBIN A1C
Hgb A1c MFr Bld: 7.7 % — ABNORMAL HIGH (ref 4.8–5.6)
Mean Plasma Glucose: 174.29 mg/dL

## 2022-06-29 LAB — CBC
HCT: 38.1 % — ABNORMAL LOW (ref 39.0–52.0)
Hemoglobin: 12 g/dL — ABNORMAL LOW (ref 13.0–17.0)
MCH: 26.2 pg (ref 26.0–34.0)
MCHC: 31.5 g/dL (ref 30.0–36.0)
MCV: 83.2 fL (ref 80.0–100.0)
Platelets: 243 10*3/uL (ref 150–400)
RBC: 4.58 MIL/uL (ref 4.22–5.81)
RDW: 14 % (ref 11.5–15.5)
WBC: 6.9 10*3/uL (ref 4.0–10.5)
nRBC: 0 % (ref 0.0–0.2)

## 2022-06-29 LAB — ECHOCARDIOGRAM COMPLETE
AR max vel: 3.77 cm2
AV Area VTI: 4.22 cm2
AV Area mean vel: 3.66 cm2
AV Mean grad: 4.2 mmHg
AV Peak grad: 8.5 mmHg
Ao pk vel: 1.46 m/s
Area-P 1/2: 2.85 cm2
Height: 72 in
S' Lateral: 3.3 cm
Weight: 3700.2 oz

## 2022-06-29 LAB — POTASSIUM: Potassium: 3.5 mmol/L (ref 3.5–5.1)

## 2022-06-29 LAB — PHOSPHORUS: Phosphorus: 3.1 mg/dL (ref 2.5–4.6)

## 2022-06-29 LAB — HIV ANTIBODY (ROUTINE TESTING W REFLEX): HIV Screen 4th Generation wRfx: NONREACTIVE

## 2022-06-29 MED ORDER — INSULIN ASPART 100 UNIT/ML IJ SOLN
0.0000 [IU] | INTRAMUSCULAR | Status: DC
  Administered 2022-06-29: 3 [IU] via SUBCUTANEOUS
  Administered 2022-06-29: 1 [IU] via SUBCUTANEOUS

## 2022-06-29 MED ORDER — METOPROLOL SUCCINATE ER 50 MG PO TB24
50.0000 mg | ORAL_TABLET | Freq: Every day | ORAL | Status: DC
  Administered 2022-06-29: 50 mg via ORAL

## 2022-06-29 MED ORDER — ACETAMINOPHEN 325 MG PO TABS: 650.0000 mg | ORAL_TABLET | Freq: Four times a day (QID) | ORAL | Status: DC | PRN

## 2022-06-29 MED ORDER — AMLODIPINE BESYLATE 5 MG PO TABS: 10.0000 mg | ORAL_TABLET | Freq: Every day | ORAL | Status: DC

## 2022-06-29 MED ORDER — CLOPIDOGREL BISULFATE 75 MG PO TABS
75.0000 mg | ORAL_TABLET | Freq: Every day | ORAL | Status: DC
  Administered 2022-06-29: 75 mg via ORAL
  Filled 2022-06-29: qty 1

## 2022-06-29 MED ORDER — METFORMIN HCL 500 MG PO TABS: 500.0000 mg | ORAL_TABLET | Freq: Two times a day (BID) | ORAL | 3 refills | Status: AC

## 2022-06-29 MED ORDER — POTASSIUM CHLORIDE CRYS ER 20 MEQ PO TBCR
40.0000 meq | EXTENDED_RELEASE_TABLET | Freq: Once | ORAL | Status: AC
  Administered 2022-06-29: 40 meq via ORAL
  Filled 2022-06-29: qty 2

## 2022-06-29 MED ORDER — POTASSIUM CHLORIDE CRYS ER 20 MEQ PO TBCR: 40.0000 meq | EXTENDED_RELEASE_TABLET | Freq: Once | ORAL | Status: DC

## 2022-06-29 MED ORDER — ATORVASTATIN CALCIUM 10 MG PO TABS: 10.0000 mg | ORAL_TABLET | Freq: Every evening | ORAL | Status: DC

## 2022-06-29 MED ORDER — POTASSIUM CHLORIDE CRYS ER 20 MEQ PO TBCR: 40.0000 meq | EXTENDED_RELEASE_TABLET | Freq: Three times a day (TID) | ORAL | Status: DC

## 2022-06-29 MED ORDER — METOPROLOL SUCCINATE ER 50 MG PO TB24
100.0000 mg | ORAL_TABLET | Freq: Every day | ORAL | Status: DC
  Filled 2022-06-29: qty 2

## 2022-06-29 MED ORDER — ENOXAPARIN SODIUM 40 MG/0.4ML IJ SOSY
40.0000 mg | PREFILLED_SYRINGE | INTRAMUSCULAR | Status: DC
  Filled 2022-06-29: qty 0.4

## 2022-06-29 MED ORDER — ACETAMINOPHEN 650 MG RE SUPP: 650.0000 mg | Freq: Four times a day (QID) | RECTAL | Status: DC | PRN

## 2022-06-29 MED ORDER — AMLODIPINE BESYLATE 10 MG PO TABS: 10.0000 mg | ORAL_TABLET | Freq: Every day | ORAL | 3 refills | Status: AC

## 2022-06-29 MED ORDER — SODIUM CHLORIDE 0.9 % IV SOLN: INTRAVENOUS | Status: DC

## 2022-06-29 MED ORDER — MAGNESIUM SULFATE 2 GM/50ML IV SOLN
2.0000 g | Freq: Once | INTRAVENOUS | Status: AC
  Administered 2022-06-29: 2 g via INTRAVENOUS
  Filled 2022-06-29: qty 50

## 2022-06-29 MED ORDER — LEVOTHYROXINE SODIUM 25 MCG PO TABS
125.0000 ug | ORAL_TABLET | Freq: Every day | ORAL | Status: DC
  Administered 2022-06-29: 125 ug via ORAL
  Filled 2022-06-29: qty 1

## 2022-06-29 MED ORDER — POTASSIUM CHLORIDE ER 10 MEQ PO CPCR: 10.0000 meq | ORAL_CAPSULE | Freq: Every day | ORAL | 0 refills | Status: AC

## 2022-06-29 MED ORDER — ALFUZOSIN HCL ER 10 MG PO TB24
10.0000 mg | ORAL_TABLET | Freq: Every day | ORAL | Status: DC
  Administered 2022-06-29: 10 mg via ORAL
  Filled 2022-06-29: qty 1

## 2022-06-29 MED ORDER — POTASSIUM CHLORIDE CRYS ER 20 MEQ PO TBCR
40.0000 meq | EXTENDED_RELEASE_TABLET | Freq: Two times a day (BID) | ORAL | Status: DC
  Administered 2022-06-29: 40 meq via ORAL
  Filled 2022-06-29: qty 2

## 2022-06-29 MED ORDER — PANTOPRAZOLE SODIUM 40 MG PO TBEC
40.0000 mg | DELAYED_RELEASE_TABLET | Freq: Every day | ORAL | Status: DC
  Administered 2022-06-29: 40 mg via ORAL
  Filled 2022-06-29: qty 1

## 2022-06-29 NOTE — TOC Progression Note (Signed)
  Transition of Care [facility]) Screening Note   Patient Details  Name: ARMANDO BUKHARI Date of Birth: Apr 23, 1961   Transition of Care [facility]) CM/SW Contact:    Shade Flood, LCSW Phone Number: 06/29/2022, 11:35 AM    Transition of Care Department [facility]) has reviewed patient and no TOC needs have been identified at this time. We will continue to monitor patient advancement through interdisciplinary progression rounds. If new patient transition needs arise, please place a TOC consult.

## 2022-06-29 NOTE — Consult Note (Addendum)
Cardiology Consultation   Patient ID: Eric [name] Passarella MRN: [id number]; DOB: 11/29/1960  Admit date: 06/28/2022 Date of Consult: 06/29/2022  PCP:  Ignatius SpeckingVyas, Dhruv B, MD   Cardiologist: Dr. Jacinto HalimGanji  Patient Profile:   Eric [name] is [name] 61 y.o. male with [name] hx of chest pain (false positive stress test in 2017 with cath showing minimal CAD with scattered 10% stenoses), HTN, HLD, Type 2 DM, Stage 3 CKD, hypothyroidism and prior CVA who is being seen 06/29/2022 for the evaluation of atypical chest pain at the request of Dr. Thomes DinningAdefeso.  History of Present Illness:   Mr. Eric [name] was previously followed by Dr. Jacinto HalimGanji in 2017 and by review of records underwent stress testing which showed evidence of inferior ischemia, therefore [name] cardiac catheterization was recommended for further evaluation. This showed normal LVEF of 55 to 60% with no significant coronary artery disease and he only had mild luminal irregularities along the LAD and RCA.  Symptoms were overall felt to be noncardiac.  He [facility] on 06/28/2022 for evaluation of numbness, dizziness and shortness of breath for the past 3 days. He had been evaluated by his PCP and EKG was felt to show ST abnormalities, therefore he was sent to the ED for further evaluation.  Initial labs showed WBC 7.2, Hgb 12.3, platelets 245, Na+ 140, K+ 3.0 and creatinine 2.10 (previously 1.32 in 2021 but was at 2.31 in 01/2021 and 2.12 in 01/2022 by review of Care Everywhere). AST 26 and ALT 25. BNP 13. Initial and repeat Hs Troponin negative at 7 and 8. CXR with no acute cardiopulmonary abnormalities. CT Head showed no acute intracranial abnormalities. EKG shows sinus tachycardia, HR 102 with slight TWI along inferior and lateral leads with inferior TWI being noted on prior tracings. Computer-generated QTc at 513 ms but at 469 ms when corrected.   By review of EDP notes, his case was discussed with Dr. Odis Hollingsheadolia who recommended [facility] but due to  difficulty with bed placement, could be [facility] in the morning. Notes mentioned the possibility of an echocardiogram and stress test.  He was on Amlodipine-Benazepril 10-40mg  daily and Chlorthalidone 25mg  daily prior to admission. Both were held given his renal function and he was started on Hydralazine 25mg  TID. Creatinine at 1.76 this AM but K+ low at 2.9 and Mg 1.6. Supplementation already ordered by the admitting team.   In talking with the patient and his wife today, he reports [name] variety of symptoms over the past several days. On Sunday, he awoke with chest discomfort which occurred at rest and symptoms spontaneously resolved within 10 to 15 minutes. He felt fine for the rest of the day. On Tuesday, he reports eating at [employer] and developed palpitations after which was lasted for 15 to 20 minutes. Felt very weak at that time. Symptoms again spontaneously resolved. Yesterday, he developed discomfort which felt like [name] cramping sensation along the left side of his neck and reports numbness/tingling along his right leg. He was evaluated by his PCP who sent him to the ED.  Overnight, he reports overall feeling well and denies any recent symptoms. Denies any recent exertional chest pain prior to admission. He does report some orthopnea but says this improves with sleeping on his side. No specific abdominal distention or lower extremity edema.  Past Medical History:  Diagnosis Date   Arthritis    Chest pain    CVA (cerebral infarction)  Diabetes mellitus without complication (HCC)    Headache    Hypertension    Neck pain    Stroke [facility])     Past Surgical History:  Procedure Laterality Date   CARDIAC CATHETERIZATION N/[name] 05/01/2016   Procedure: Left Heart Cath and Coronary Angiography;  Surgeon: Yates Decamp, MD;  Location: [facility] INVASIVE CV LAB;  Service: Cardiovascular;  Laterality: N/[name];   rotator cuff replacement Left      Home Medications:  Prior to  Admission medications   Medication Sig Start Date End Date Taking? Authorizing Provider  alfuzosin (UROXATRAL) 10 MG 24 hr tablet Take 10 mg by mouth daily with breakfast.   Yes [provider]  amLODipine-benazepril (LOTREL) 10-40 MG capsule 1 capsule. 12/14/20  Yes [provider]  atorvastatin (LIPITOR) 10 MG tablet Take 10 mg by mouth every evening.  03/26/15  Yes [provider]  chlorthalidone (HYGROTON) 25 MG tablet Take 25 mg by mouth daily. 06/20/22  Yes [provider]  clopidogrel (PLAVIX) 75 MG tablet TAKE ONE TABLET BY MOUTH ONCE DAILY Patient taking differently: Take 75 mg by mouth daily. 04/02/16  Yes Marvel Plan, MD  glipiZIDE (GLUCOTROL) 5 MG tablet Take 1 tablet (5 mg total) by mouth 2 (two) times daily before [name] meal. 08/26/18  Yes Nida, Denman George, MD  JANUVIA 100 MG tablet Take 100 mg by mouth daily. 06/20/22  Yes [provider]  JARDIANCE 25 MG TABS tablet Take 25 mg by mouth daily.   Yes [provider]  levocetirizine (XYZAL) 5 MG tablet Take 5 mg by mouth daily. 06/06/22  Yes [provider]  levothyroxine (SYNTHROID) 125 MCG tablet Take 125 mcg by mouth daily.   Yes [provider]  metFORMIN (GLUCOPHAGE) 1000 MG tablet Take 1 tablet (1,000 mg total) by mouth See admin instructions. 2 tablets (1000 mg) in the morning, and 1 tablet (500 mg) every evening 05/26/18  Yes Nida, Denman George, MD  metoprolol succinate (TOPROL-XL) 100 MG 24 hr tablet Take 100 mg by mouth daily.  01/30/16  Yes [provider]  pantoprazole (PROTONIX) 40 MG tablet Take 1 tablet (40 mg total) by mouth daily. 11/25/19  Yes Ward, Layla Maw, DO  potassium chloride (MICRO-K) 10 MEQ CR capsule Take 10 mEq by mouth daily. 05/31/22  Yes [provider]  promethazine-dextromethorphan (PROMETHAZINE-DM) 6.25-15 MG/5ML syrup Take 5 mLs by mouth every 4 (four) hours as needed. 03/16/22  Yes [provider]  Blood  Glucose Monitoring Suppl (ACCU-CHEK AVIVA) device Use as instructed Patient taking differently: 1 each by Other route daily. 11/20/17   Roma Kayser, MD  glucose blood (ACCU-CHEK AVIVA) test strip Use to test blood glucose 2 times [name] day. Patient taking differently: 1 each by Other route in the morning and at bedtime. 11/20/17   Roma Kayser, MD  ondansetron (ZOFRAN ODT) 4 MG disintegrating tablet Take 1 tablet (4 mg total) by mouth every 6 (six) hours as needed. Patient not taking: Reported on 06/28/2022 11/25/19   Ward, Layla Maw, DO    Inpatient Medications: Scheduled Meds:  alfuzosin  10 mg Oral Q breakfast   atorvastatin  10 mg Oral QPM   clopidogrel  75 mg Oral Daily   enoxaparin (LOVENOX) injection  40 mg Subcutaneous Q24H   hydrALAZINE  25 mg Oral TID   insulin aspart  0-9 Units Subcutaneous Q4H   levothyroxine  125 mcg Oral Q0600   pantoprazole  40 mg Oral Daily   potassium chloride  40 mEq Oral BID   Continuous Infusions:  magnesium sulfate bolus IVPB 2 g (06/29/22 0818)   PRN Meds: acetaminophen **OR** acetaminophen  Allergies:    Allergies  Allergen Reactions   Bee Venom Anaphylaxis    EPIPEN required   Contrast Media [Iodinated Contrast Media] Nausea Only    Social History:   Social History   Socioeconomic History   Marital status: Married    Spouse name: Not on file   Number of children: Not on file   Years of education: Not on file   Highest education level: Not on file  Occupational History   Not on file  Tobacco Use   Smoking status: Former    Types: Cigarettes    Quit date: 02/09/2015    Years since quitting: 7.3   Smokeless tobacco: Never  Vaping Use   Vaping Use: Never used  Substance and Sexual Activity   Alcohol use: No    Alcohol/week: 0.0 standard drinks of alcohol   Drug use: No   Sexual activity: Not on file  Other Topics Concern   Not on file  Social History Narrative   Drinks 1 cup of caffeine daily.   Social  Determinants of Health   Financial Resource Strain: Not on file  Food Insecurity: No Food Insecurity (06/29/2022)   Hunger Vital Sign    Worried About Running Out of Food in the Last Year: Never true    Ran Out of Food in the Last Year: Never true  Transportation Needs: No Transportation Needs (06/29/2022)   PRAPARE - Administrator, Civil Service (Medical): No    Lack of Transportation (Non-Medical): No  Physical Activity: Not on file  Stress: Not on file  Social Connections: Not on file  Intimate Partner Violence: Not At Risk (06/29/2022)   Humiliation, Afraid, Rape, and Kick questionnaire    Fear of Current or Ex-Partner: No    Emotionally Abused: No    Physically Abused: No    Sexually Abused: No    Family History:    Family History  Problem Relation Age of Onset   Stroke Father    Heart disease Father    Seizures Paternal Uncle      ROS:  Please see the history of present illness.   All other ROS reviewed and negative.     Physical Exam/Data:   Vitals:   06/28/22 2346 06/28/22 2351 06/29/22 0355 06/29/22 0800  BP:  (!) 144/78 110/70 129/89  Pulse:  87 80 71  Resp:  16 20   Temp: 97.9 F (36.6 C) 98.3 F (36.8 C) 98.4 F (36.9 C) 98.4 F (36.9 C)  TempSrc: Oral Oral Oral Oral  SpO2:  99% 99% 98%  Weight:  104.9 kg    Height:  6' (1.829 m)     No intake or output data in the 24 hours ending 06/29/22 0907    06/28/2022   11:51 PM 11/25/2019    1:37 PM 08/26/2018    9:30 AM  Last 3 Weights  Weight (lbs) 231 lb 4.2 oz 232 lb 233 lb  Weight (kg) 104.9 kg 105.235 kg 105.688 kg     Body mass index is 31.36 kg/m.  General:  Well nourished, well developed male appearing in no acute distress HEENT: normal Neck: no JVD Vascular: No carotid bruits; Distal pulses 2+ bilaterally Cardiac:  normal S1, S2; RRR; no murmur  Lungs:  clear to auscultation bilaterally, no wheezing, rhonchi or rales  Abd: soft, nontender,  no hepatomegaly  Ext: no pitting  edema Musculoskeletal:  No deformities, BUE and BLE strength normal and equal Skin: warm and dry  Neuro:  CNs 2-12 intact, no focal abnormalities noted Psych:  Normal affect   EKG:  The EKG was personally reviewed and demonstrates: Sinus tachycardia, HR 102 with slight TWI along inferior and lateral leads with inferior TWI being noted on prior tracings. Computer-generated QTc at 513 ms but at 469 ms when corrected.   Telemetry:  Telemetry was personally reviewed and demonstrates: Normal sinus rhythm, heart rate in 70's to 80's.  Relevant CV Studies:  Echocardiogram: 03/2015 Study Conclusions   - Left ventricle: The cavity size was normal. Wall thickness was    increased in [name] pattern of mild LVH. Systolic function was normal.    The estimated ejection fraction was in the range of 50% to 55%.    Doppler parameters are consistent with abnormal left ventricular    relaxation (grade 1 diastolic dysfunction).   Event Monitor: 03/2015 Sinus rhythm Rare PVCs No sustained arrhythmias No afib  LHC: 04/2016 Coronary angiogram 05/01/2016: Normal LV systolic function, EF 55-60%. No significant coronary artery disease. Right dominant separation. Minimal luminal irregularity evident in the LAD and also RCA.   Recommend addition: Eval lesion for noncardiac causes of chest pain is indicated. False-positive stress test.      Echocardiogram: 01/2021 Left Ventricle: Systolic function is normal. EF: 55-60%.    Left Ventricle: There is moderate concentric hypertrophy.    Tricuspid Valve: There is mild regurgitation.    Left Atrium: Injection of agitated saline documents no interatrial  shunt.   Laboratory Data:  High Sensitivity Troponin:   Recent Labs  Lab 06/28/22 1801 06/28/22 2001  TROPONINIHS 7 8     Chemistry Recent Labs  Lab 06/28/22 1801 06/29/22 0351  NA 140 141  K 3.0* 2.9*  CL 108 105  CO2 24 25  GLUCOSE 130* 130*  BUN 17 17  CREATININE 2.10* 1.76*  CALCIUM 8.5*  8.7*  MG  --  1.6*  GFRNONAA 35* 44*  ANIONGAP 8 11    Recent Labs  Lab 06/28/22 1801 06/29/22 0351  PROT 6.9 6.7  ALBUMIN 4.1 3.8  AST 26 25  ALT 25 23  ALKPHOS 48 45  BILITOT 0.9 0.6   Lipids No results for input(s): "CHOL", "TRIG", "HDL", "LABVLDL", "LDLCALC", "CHOLHDL" in the last 168 hours.  Hematology Recent Labs  Lab 06/28/22 1801 06/29/22 0351  WBC 7.2 6.9  RBC 4.63 4.58  HGB 12.3* 12.0*  HCT 38.1* 38.1*  MCV 82.3 83.2  MCH 26.6 26.2  MCHC 32.3 31.5  RDW 13.9 14.0  PLT 245 243   Thyroid No results for input(s): "TSH", "FREET4" in the last 168 hours.  BNP Recent Labs  Lab 06/28/22 1801  BNP 13.0    DDimer  Recent Labs  Lab 06/28/22 1801  DDIMER 0.30     Radiology/Studies:  CT Head Wo Contrast  Result Date: 06/28/2022 CLINICAL DATA:  No logic deficit. EXAM: CT HEAD WITHOUT CONTRAST TECHNIQUE: Contiguous axial images were obtained from the base of the skull through the vertex without intravenous contrast. RADIATION DOSE REDUCTION: This exam was performed according to the departmental dose-optimization program which includes automated exposure control, adjustment of the mA and/or kV according to patient size and/or use of iterative reconstruction technique. COMPARISON:  None Available. FINDINGS: Brain: The ventricles and sulci are appropriate size for the patient's age. The gray-white matter discrimination is preserved. There is no  acute intracranial hemorrhage. No mass effect or midline shift. No extra-axial fluid collection. Vascular: No hyperdense vessel or unexpected calcification. Skull: Normal. Negative for fracture or focal lesion. Sinuses/Orbits: There is mild diffuse mucoperiosteal thickening of paranasal sinuses. No air-fluid level the mastoid air cells are clear. Other: None IMPRESSION: 1. No acute intracranial pathology. 2. Mild paranasal sinus disease. Electronically Signed   By: Anner Crete M.D.   On: 06/28/2022 17:54   DG Chest 2  View  Result Date: 06/28/2022 CLINICAL DATA:  Shortness of breath, RIGHT leg numbness, dizziness, shortness of breath with activity for 3 days EXAM: CHEST - 2 VIEW COMPARISON:  02/22/2021 FINDINGS: Normal heart size, mediastinal contours, and pulmonary vascularity. Lungs clear. No pleural effusion or pneumothorax. Bones unremarkable. IMPRESSION: Normal exam. Electronically Signed   By: Lavonia Dana M.D.   On: 06/28/2022 16:58     Assessment and Plan:   1. Atypical Chest Pain - He reports having an episode of chest discomfort several days ago which occurred at rest and spontaneously resolved within 15 minutes. No recurrent chest discomfort since. - Troponin values have been negative at 7 and 8. His EKG does show slight T wave inversion along inferior and lateral leads but he did have inferior TWI on prior tracings. - He was kept NPO for possible stress testing but given his history of [name] false-positive stress test in 2017, I am not sure this is the best option for the patient. Catheterization at that time did show minimal CAD as outlined above. Given his atypical symptoms and CKD, would anticipate obtaining an echocardiogram initially. If this is reassuring, would likely hold off on ischemic evaluation. If he has recurrent symptoms in the future, could consider [name] Coronary CT pending reassessment of renal function but he would also need prophylactic medications for contrast allergy as he had nausea with this in the past.  2. Palpitations - He reports intermittent palpitations over the past few days but has maintained normal sinus rhythm this admission. We reviewed that he could possibly be having episodes of an arrhythmia triggered by his electrolyte abnormalities. Would consider [name] 2-week Zio patch at the time of hospital discharge to assess for significant arrhythmias, especially given his history of [name] CVA and TIA.  3. HTN - His blood pressure has overall been well-controlled, at 129/89 on most recent  check. He was on Chlorthalidone along with Amlodipine-Benazepril prior to admission. He has been started on Hydralazine 25 mg 3 times daily with Chlorthalidone being discontinued. Pending BP trend, could add back Amlodipine or use [name] beta-blocker.  4. HLD - Followed by his PCP as an outpatient. He has been continued on Atorvastatin 10mg  daily.   5. Stage 3 CKD - His creatinine was at 1.32 in 2021 but appears he has experienced progressive CKD in the interim as his creatinine was at 2.31 in 01/2021 and 2.12 in 01/2022. This was elevated at 2.10 on admission and improved to 1.76 today. Chlorthalidone has been discontinued as discussed above. Does not take NSAIDS. Would recommend that he establish with Nephrology as an outpatient.  6. Hypokalemia/Hypomagnesemia - K+ at 2.9 this AM and Mg at 1.6. Replacement has been ordered by the admitting team.   7. Right Leg Weakness/Paraesthesias - Head CT without acute findings. If recurrence, could consider [name] Brain MRI given his neurological history.    For questions or updates, please contact [place] Please consult [email or web address] for contact info under    Signed, Erma Heritage, PA-C  06/29/2022  9:07 AM  Attending note  Patient seen and discussed with PA Strader, I agree with her documentation. 61 yo male history of hyperlipidemia, HTN, DM2, OSA, prior CVA presents with chest pains, palpitations, dizziness   Reports episode of chest pain that woke him from sleep Sunday morning. 10/10 left sided pressure/sharp pain left sided. Lasted about 5 minutes and self resolved. Worst with deep breathing. No current pain since that time. He does however report since that time episodes of feeling his heart racing. This can occur at rest or with activity. Some associated dizziness, SOB. Reports on Monday had some diarrhea, 3 episodes loose watery stools. Drinks 1 cup of coffee, 3 or more cups of iced tea daily, 2 bottles of water. No significant  EtOH.     WBC 7.2 Hgb 12.3 Plt 245 K 3 Cr 2.10 BNP 13 Ddimer 0.30 Trop 7-->8 CXR no acute process EKG SR, inferior and lateral precordial TWIs. Prior EKGs subtle changs in these leads CT head: no acute process  2016 echo: LVEF 50-55%, grade I dd  03/2016 lexiscan: The perfusion imaging study demonstrates [name] small sized moderate inferior wall ischemia.  The left ventricle was mildly dilated both in rest and stress images, LV end diastolic volume 176 mL.  Left ventricular systolic function calculated by QGS was 44% with mild global hypokinesis and inferior hypokinesis. This is an intermediate risk study, clinical correlation recommended   04/2016 cath: no significant CAD, minimal luminal irregularities   1.Chest pain - cath 04/2016 no significant CAD - isoalted episode on Sunday somewhat atypical without recurrence. Ongoing symptoms have been more so palpitations, dizziness, SOB.  - trops negative without significant delta, EKG SR inferior/lateral precordial TWIs. From prior EKGs subtle changes in these leads in the past.  - echo pending  - isolated episode of atypical chest pain without objective evidence of ischemia. If echo is benign would not plan on ischemic testing.   2. Palpitations/Dizziness - episodes of heart racing at home with dizzines and SOB - K was 3 on admission, Mg 1.6. Had some diarrhea, cough, nasal congestion. Perhaps all contributed to ectopy or perhaps transient arrhythmias. Benign telemetry here - Would normalize electolytes, I think if recurrent episodes at f/u would consider monitor at that time - with standing dizziness we will check orthostatics    3.CKD IIIB - 01/2022 Cr 2.12  - on admitCr 2.1, did improve to 1.76 with fluids. Would continue IVFs today - would d/c his chlorthalidone, ACE-i   4.Hypokalemia/Hypomagnesemia - for replacement today - may be related to diarrhea, he was also on chlothalidone.   5. HTN - with CKDIIIB would d/c chlorthalidone,  ACEi - would favor restaring his norvasc and stopping hydral which is new, if additional agent needed could consider restarting hydral.  -not entirely clear the indications for the torpol he was on at home. Would need to wean over time if going to stop, for now restart lower dose 50mg  daily.    On manual measure QTc is 420 as opposed to 513 measured by computer  Addendum 1120 AM Echo shows normal LVEF, no WMAs. Overall cardiac workup has been benign, no plans for inpatient ischemic testing. If recurrent symptoms as outpatient could reconsider. Regarding palpitations perhaps driven by low K and Mg which are being replaced. If recurrences with normal electrolytes consider outpatient monitor. IVC small on echo suggestiong hypovolemia perhaps from diarrhea and diuretic, perhaps Cr will continue to improvie with IVFs I have written for NS at 165ml/hr x 10  hrs. Given multiple electrolyte abnormalities, hypovolemia, bp med changes would consider monitoring today and possible d/c today pending bp's and labs      Dina Rich MD

## 2022-06-29 NOTE — Hospital Course (Signed)
Mr. Godwin is a 61 y.o. M with obesity, HTN, HLD, DM, OSA, prior CVA without deficits, and hypothyroidism who presented with exertional L sided chest pain for a few days.  In the ER, d-dimer normal.  ECG showed nonspecific ST changes, troponins negative.

## 2022-06-29 NOTE — Discharge Summary (Signed)
Physician Discharge Summary   Patient: Eric [name] MRN: [id number] DOB: October 05, 1960  Admit date:     06/28/2022  Discharge date: 06/29/22  Discharge Physician: Edwin Dada   PCP: Glenda Chroman, MD     Recommendations at discharge:  Follow up with PCP Dr. Woody Seller in 1 week Dr. Woody Seller:  Please obtain BMP in 1 week and check K and Cr (discharge K 1.7) Please refer to Nephrology for CKD   Follow up with Cardiology, Dr. Einar Gip in 2-4 weeks Dr. Einar Gip:  Please arrange outpatient cardiac monitoring for palpitations if recurrent  Please evaluate for repeat stress test if recurrent chest pain     Discharge Diagnoses: Principal Problem:   Atypical chest pain Active Problems:   Obstructive sleep apnea   Obesity   Mixed hyperlipidemia   Essential hypertension   History of stroke   Uncontrolled type 2 diabetes mellitus with hyperglycemia (HCC)   Prolonged QT interval   BPH (benign prostatic hyperplasia)   Acquired hypothyroidism   Stage 3b chronic kidney disease (CKD) ([place])   Hypomagnesemia   Hypokalemia      Hospital Course: Mr. Eric [name] is a 61 y.o. M with obesity, HTN, HLD, DM, OSA, prior CVA without deficits, and hypothyroidism who presented with L sided chest pain and palpitations.  Patient presented usual state of health until several days ago, woke from sleep with an episode of chest pain.  This resolved, but the 2 days later at work the patient had palpitations "like my heart was pounding out of my chest".  This recurred, so he went to PCP who referred to ER.  In the ER, d-dimer normal.  ECG showed nonspecific ST changes, troponins negative.      Atypical chest pain Occurred at rest.  Troponins, CXR, dimer normal.  ECG nonspecific.  Evaluated by Cardiology and Echo showed normal E and no RWMA.  After false-positive stress test in 2017, patient had angiography that showed no significant coronary disease.  Would recommend follow up with primary cardiologist  for further risk stratification.    Palpitations Observed on telemetry overnight.  No ectopy or arrythmia.  Electrolytes corrected.  Recommend Cardiology follow up and consideration of Zio patch.    Elevated creatinine Patient's creatinine in the [facility] system were 1.3 back in 2020, but since 2021, all values in our systems have been 2-2.3.  Here, with fluids, his Cr improved to 1.7.  I suspect he has advancing CKD.  Recommend PCP follow up in 1 week, and if Cr truly in CKD IIIb range, would recommend Nephrology follow up.    Hypertension Cardiology recommended stopping chlorthalidone due to hypokalemia.  They also recommended stopping ACEi due to elevated creatinine.  Their recommendation was for starting hydralazine if needed.  Hypokalemia Hypomagnesemia Supplemented and resolved.          The [facility] Controlled Substances Registry was reviewed for this patient prior to discharge.   Consultants: Cardiology Procedures performed: Echo  Disposition: Home   DISCHARGE MEDICATION: Allergies as of 06/29/2022       Reactions   Bee Venom Anaphylaxis   EPIPEN required   Contrast Media [iodinated Contrast Media] Nausea Only        Medication List     STOP taking these medications    amLODipine-benazepril 10-40 MG capsule Commonly known as: LOTREL   chlorthalidone 25 MG tablet Commonly known as: HYGROTON       TAKE these medications    Accu-Chek Aviva device  Use as instructed What changed:  how much to take how to take this when to take this additional instructions   alfuzosin 10 MG 24 hr tablet Commonly known as: UROXATRAL Take 10 mg by mouth daily with breakfast.   amLODipine 10 MG tablet Commonly known as: NORVASC Take 1 tablet (10 mg total) by mouth daily. Start taking on: June 30, 2022   atorvastatin 10 MG tablet Commonly known as: LIPITOR Take 10 mg by mouth every evening.   clopidogrel 75 MG tablet Commonly  known as: PLAVIX TAKE ONE TABLET BY MOUTH ONCE DAILY   glipiZIDE 5 MG tablet Commonly known as: GLUCOTROL Take 1 tablet (5 mg total) by mouth 2 (two) times daily before a meal.   glucose blood test strip Commonly known as: Accu-Chek Aviva Use to test blood glucose 2 times a day. What changed:  how much to take how to take this when to take this additional instructions   Januvia 100 MG tablet Generic drug: sitaGLIPtin Take 100 mg by mouth daily.   Jardiance 25 MG Tabs tablet Generic drug: empagliflozin Take 25 mg by mouth daily.   levocetirizine 5 MG tablet Commonly known as: XYZAL Take 5 mg by mouth daily.   levothyroxine 125 MCG tablet Commonly known as: SYNTHROID Take 125 mcg by mouth daily.   metFORMIN 500 MG tablet Commonly known as: GLUCOPHAGE Take 1 tablet (500 mg total) by mouth 2 (two) times daily with a meal. What changed:  medication strength how much to take when to take this additional instructions   metoprolol succinate 100 MG 24 hr tablet Commonly known as: TOPROL-XL Take 100 mg by mouth daily.   pantoprazole 40 MG tablet Commonly known as: PROTONIX Take 1 tablet (40 mg total) by mouth daily.   potassium chloride 10 MEQ CR capsule Commonly known as: MICRO-K Take 1 capsule (10 mEq total) by mouth daily.        Follow-up Information     Vyas, Dhruv B, MD. Schedule an appointment as soon as possible for a visit in 1 week(s).   Specialty: Internal Medicine Why: and get labs Contact information: [address] [place] [address] [phone]         Yates DecampGanji, Jay, MD Follow up.   Specialty: Cardiology Contact information: [address] [place] [id number] [phone]                 Discharge Instructions     Discharge instructions   Complete by: As directed    **IMPORTANT DISCHARGE INSTRUCTIONS**    From Dr. Maryfrances Bunnellanford: You were admitted for palpitations and chest pain Here, we found that your ECG  (electrical activity of the heart) showed no definitive damage Your heart enzymes (troponins) were very reassuring that no heart attack had happened Blood tests ruled out a pulmonary embolism (blood clot in the lungs) Chest x-ray imaging was normal   We observed your heart overnight and saw no abnormal palpitations or abnormal rhythms We did notice your potassium and magnesium were low, which can lead to palpitations in some people  We did an ultrasound of your heart that showed that the heart squeeze and function was normal and reassuring   You were given potassium and magnesium here  For the next week:  Double your potassium dose and take in the afternoon (take 20 mEq) After a week, get your potassium and creatinine checked by Dr. Sherril CroonVyas   For now, STOP chlorthalidone and your amlodipine and benazepril  combination pill Continue metoprolol I have sent a new amlodipine prescription to your pharmacy, you should take this blood pressure medicine too (just the amlodipine component, not the benazepril for now)  Go see Dr. Sherril Croon in 1 week Ask for a referral to Nephrology  Have him check your labs  ALSO: reduce your metformin dose for now Take only 500 mg twice daily Ask Dr. Sherril Croon about this   Increase activity slowly   Complete by: As directed        Discharge Exam: Filed Weights   06/28/22 2351  Weight: 104.9 kg    General: Pt is alert, awake, not in acute distress Cardiovascular: RRR, nl S1-S2, no murmurs appreciated.   No LE edema.   Respiratory: Normal respiratory rate and rhythm.  CTAB without rales or wheezes. Abdominal: Abdomen soft and non-tender.  No distension or HSM.   Neuro/Psych: Strength symmetric in upper and lower extremities.  Judgment and insight appear normal.   Condition at discharge: good  The results of significant diagnostics from this hospitalization (including imaging, microbiology, ancillary and laboratory) are listed below for reference.    Imaging Studies: ECHOCARDIOGRAM COMPLETE  Result Date: 06/29/2022    ECHOCARDIOGRAM REPORT   Patient Name:   CYLER KAPPES Date of Exam: 06/29/2022 Medical Rec #:  [id number]       Height:       72.0 in Accession #:    [id number]      Weight:       231.3 lb Date of Birth:  [birth year]        BSA:          2.266 m Patient Age:    61 years        BP:           149/89 mmHg Patient Gender: M               HR:           74 bpm. [facility] Procedure: 2D Echo, Cardiac Doppler and Color Doppler Indications:    Chest Pain R07.9  History:        Patient has prior history of Echocardiogram examinations, most                 recent 04/18/2015. Stroke; Risk Factors:Current Smoker,                 Dyslipidemia, Diabetes and Hypertension. OSA (obstructive sleep                 apnea).  Sonographer:    Celesta Gentile RCS Referring Phys: [id number] OLADAPO ADEFESO IMPRESSIONS  1. Left ventricular ejection fraction, by estimation, is 60 to 65%. The left ventricle has normal function. The left ventricle has no regional wall motion abnormalities. There is moderate left ventricular hypertrophy. Left ventricular diastolic parameters are consistent with Grade I diastolic dysfunction (impaired relaxation).  2. Right ventricular systolic function is normal. The right ventricular size is normal. There is normal pulmonary artery systolic pressure.  3. The mitral valve is normal in structure. No evidence of mitral valve regurgitation. No evidence of mitral stenosis.  4. The tricuspid valve is abnormal.  5. The aortic valve is tricuspid. Aortic valve regurgitation is not visualized. No aortic stenosis is present.  6. IVC is small suggesting low RA pressure and hypovolemia. FINDINGS  Left Ventricle: Left ventricular ejection fraction, by estimation, is 60 to 65%. The left ventricle has normal function. The left ventricle has  no regional wall motion abnormalities. The left ventricular internal cavity size was normal in size. There  is  moderate left ventricular hypertrophy. Left ventricular diastolic parameters are consistent with Grade I diastolic dysfunction (impaired relaxation). Normal left ventricular filling pressure. Right Ventricle: The right ventricular size is normal. Right vetricular wall thickness was not well visualized. Right ventricular systolic function is normal. There is normal pulmonary artery systolic pressure. The tricuspid regurgitant velocity is 1.97 m/s, and with an assumed right atrial pressure of 3 mmHg, the estimated right ventricular systolic pressure is 18.5 mmHg. Left Atrium: Left atrial size was normal in size. Right Atrium: Right atrial size was normal in size. Pericardium: There is no evidence of pericardial effusion. Mitral Valve: The mitral valve is normal in structure. No evidence of mitral valve regurgitation. No evidence of mitral valve stenosis. Tricuspid Valve: The tricuspid valve is abnormal. Tricuspid valve regurgitation is mild . No evidence of tricuspid stenosis. Aortic Valve: The aortic valve is tricuspid. Aortic valve regurgitation is not visualized. No aortic stenosis is present. Aortic valve mean gradient measures 4.2 mmHg. Aortic valve peak gradient measures 8.5 mmHg. Aortic valve area, by VTI measures 4.22 cm. Pulmonic Valve: The pulmonic valve was not well visualized. Pulmonic valve regurgitation is not visualized. No evidence of pulmonic stenosis. Aorta: The aortic root is normal in size and structure. Venous: IVC is small suggesting low RA pressure and hypovolemia. IAS/Shunts: No atrial level shunt detected by color flow Doppler.  LEFT VENTRICLE PLAX 2D LVIDd:         4.80 cm   Diastology LVIDs:         3.30 cm   LV e' medial:    6.20 cm/s LV PW:         1.40 cm   LV E/e' medial:  8.9 LV IVS:        1.30 cm   LV e' lateral:   8.27 cm/s LVOT diam:     2.50 cm   LV E/e' lateral: 6.6 LV SV:         104 LV SV Index:   46 LVOT Area:     4.91 cm  RIGHT VENTRICLE RV S prime:     18.05 cm/s TAPSE  (M-mode): 2.6 cm LEFT ATRIUM             Index        RIGHT ATRIUM           Index LA diam:        3.50 cm 1.54 cm/m   RA Area:     17.90 cm LA Vol (A2C):   66.2 ml 29.21 ml/m  RA Volume:   40.10 ml  17.69 ml/m LA Vol (A4C):   45.2 ml 19.95 ml/m LA Biplane Vol: 56.1 ml 24.75 ml/m  AORTIC VALVE AV Area (Vmax):    3.77 cm AV Area (Vmean):   3.66 cm AV Area (VTI):     4.22 cm AV Vmax:           145.74 cm/s AV Vmean:          91.613 cm/s AV VTI:            0.245 m AV Peak Grad:      8.5 mmHg AV Mean Grad:      4.2 mmHg LVOT Vmax:         112.00 cm/s LVOT Vmean:        68.400 cm/s LVOT VTI:  0.211 m LVOT/AV VTI ratio: 0.86  AORTA Ao Root diam: 3.80 cm MITRAL VALVE               TRICUSPID VALVE MV Area (PHT): 2.85 cm    TR Peak grad:   15.5 mmHg MV Decel Time: 266 msec    TR Vmax:        197.00 cm/s MV E velocity: 54.90 cm/s MV A velocity: 63.35 cm/s  SHUNTS MV E/A ratio:  0.87        Systemic VTI:  0.21 m                            Systemic Diam: 2.50 cm Dina Rich MD Electronically signed by Dina Rich MD Signature Date/Time: 06/29/2022/11:05:53 AM    Final    CT Head Wo Contrast  Result Date: 06/28/2022 CLINICAL DATA:  No logic deficit. EXAM: CT HEAD WITHOUT CONTRAST TECHNIQUE: Contiguous axial images were obtained from the base of the skull through the vertex without intravenous contrast. RADIATION DOSE REDUCTION: This exam was performed according to the departmental dose-optimization program which includes automated exposure control, adjustment of the mA and/or kV according to patient size and/or use of iterative reconstruction technique. COMPARISON:  None Available. FINDINGS: Brain: The ventricles and sulci are appropriate size for the patient's age. The gray-white matter discrimination is preserved. There is no acute intracranial hemorrhage. No mass effect or midline shift. No extra-axial fluid collection. Vascular: No hyperdense vessel or unexpected calcification. Skull: Normal.  Negative for fracture or focal lesion. Sinuses/Orbits: There is mild diffuse mucoperiosteal thickening of paranasal sinuses. No air-fluid level the mastoid air cells are clear. Other: None IMPRESSION: 1. No acute intracranial pathology. 2. Mild paranasal sinus disease. Electronically Signed   By: Elgie Collard M.D.   On: 06/28/2022 17:54   DG Chest 2 View  Result Date: 06/28/2022 CLINICAL DATA:  Shortness of breath, RIGHT leg numbness, dizziness, shortness of breath with activity for 3 days EXAM: CHEST - 2 VIEW COMPARISON:  02/22/2021 FINDINGS: Normal heart size, mediastinal contours, and pulmonary vascularity. Lungs clear. No pleural effusion or pneumothorax. Bones unremarkable. IMPRESSION: Normal exam. Electronically Signed   By: Ulyses Southward M.D.   On: 06/28/2022 16:58    Microbiology: No results found for this or any previous visit.  Labs: CBC: Recent Labs  Lab 06/28/22 1801 06/29/22 0351  WBC 7.2 6.9  NEUTROABS 4.3  --   HGB 12.3* 12.0*  HCT 38.1* 38.1*  MCV 82.3 83.2  PLT 245 243   Basic Metabolic Panel: Recent Labs  Lab 06/28/22 1801 06/29/22 0351 06/29/22 1347  NA 140 141  --   K 3.0* 2.9* 3.5  CL 108 105  --   CO2 24 25  --   GLUCOSE 130* 130*  --   BUN 17 17  --   CREATININE 2.10* 1.76*  --   CALCIUM 8.5* 8.7*  --   MG  --  1.6* 2.1  PHOS  --  3.1  --    Liver Function Tests: Recent Labs  Lab 06/28/22 1801 06/29/22 0351  AST 26 25  ALT 25 23  ALKPHOS 48 45  BILITOT 0.9 0.6  PROT 6.9 6.7  ALBUMIN 4.1 3.8   CBG: Recent Labs  Lab 06/29/22 0354 06/29/22 0732 06/29/22 1131  GLUCAP 145* 117* 204*    Discharge time spent: approximately 35 minutes spent on discharge counseling, evaluation of patient on day of discharge, and coordination  of discharge planning with nursing, social work, pharmacy and case management  Signed: Alberteen Sam, MD [facility] 06/29/2022

## 2022-06-29 NOTE — Progress Notes (Signed)
*  PRELIMINARY RESULTS* Echocardiogram 2D Echocardiogram has been performed.  Eric [name] 06/29/2022, 10:33 AM

## 2022-06-29 NOTE — Progress Notes (Signed)
CP/SHOB managed this shift while at rest.  NSR on monitor.  Pt on room air.  Ongoing numbness in right foot but no worsening of symptoms.  Remains NPO.  Wife at bedside.

## 2022-06-29 NOTE — Progress Notes (Signed)
Laying BP 131/81 HR 79  Sitting BP 137/96 HR 83  Standing BP 145/97 HR 90  Patient reports no dizziness or chest pain.

## 2022-06-29 NOTE — Plan of Care (Signed)

## 2022-08-31 ENCOUNTER — Ambulatory Visit: Payer: BC Managed Care – PPO | Admitting: Internal Medicine

## 2022-08-31 ENCOUNTER — Encounter: Payer: Self-pay | Admitting: Internal Medicine

## 2022-08-31 VITALS — BP 159/96 | HR 74 | Ht 72.0 in | Wt 233.8 lb

## 2022-08-31 DIAGNOSIS — I10 Essential (primary) hypertension: Secondary | ICD-10-CM

## 2022-08-31 DIAGNOSIS — R55 Syncope and collapse: Secondary | ICD-10-CM

## 2022-08-31 DIAGNOSIS — R079 Chest pain, unspecified: Secondary | ICD-10-CM

## 2022-08-31 MED ORDER — ISOSORBIDE DINITRATE 30 MG PO TABS: 30.0000 mg | ORAL_TABLET | Freq: Three times a day (TID) | ORAL | 6 refills | Status: DC

## 2022-09-03 NOTE — Progress Notes (Signed)
Primary Physician/Referring:  Glenda Chroman, MD  Patient ID: Eric Glass, male    DOB: 12-21-1960, 62 y.o.   MRN: [id number]  Chief Complaint  Patient presents with   New Patient (Initial Visit)   Shortness of Breath   Chest Pain   HPI:    Eric Glass  is a 62 y.o. with past medical history significant for diabetes and hypertension who is here to establish care with cardiology. Patient has been having shortness of breath and chest pain with activity. His blood pressure is pretty elevated today and he admits it has been running pretty high lately. He states his blood pressure is never <130/80 and he has no idea what it runs with activity. He is agreeable to adding another agent for better blood pressure control. Of note, he had an echocardiogram in November 2023 when he went to the hospital and it did not show any regional wall motion abnormality or reduced ejection fraction. Patient thinks this could be related to his blood pressure since it has been much higher than usual, especially within the last couple of months. He did have a syncopal episode which was unrelated to his other symptoms. He states it happened when he was changing positions and he didn't fully lose consciousness. Patient felt fine after and there was no post-ictal period. He admits to not drinking enough water and will try to be mindful of this. Otherwise, patient has been doing pretty well.   Past Medical History:  Diagnosis Date   Arthritis    Chest pain    CVA (cerebral infarction)    Diabetes mellitus without complication ([place])    Headache    Hypertension    Neck pain    Stroke [facility])    Past Surgical History:  Procedure Laterality Date   CARDIAC CATHETERIZATION N/A 05/01/2016   Procedure: Left Heart Cath and Coronary Angiography;  Surgeon: Adrian Prows, MD;  Location: [place] [facility];  Service: Cardiovascular;  Laterality: N/A;   rotator cuff replacement Left    Family History  Problem Relation Age of  Onset   Stroke Father    Heart disease Father    Seizures Paternal Uncle     Social History   Tobacco Use   Smoking status: Former    Types: Cigarettes    Quit date: 02/09/2015    Years since quitting: 7.5   Smokeless tobacco: Never  Substance Use Topics   Alcohol use: No    Alcohol/week: 0.0 standard drinks of alcohol   Marital Status: Married  ROS  Review of Systems  Cardiovascular:  Positive for chest pain, dyspnea on exertion and syncope.  Respiratory:  Positive for shortness of breath.    Objective  Blood pressure (!) 159/96, pulse 74, height 6' (1.829 m), weight 233 lb 12.8 oz (106.1 kg), SpO2 97 %. Body mass index is 31.71 kg/m.     08/31/2022    8:58 AM 06/29/2022   12:13 PM 06/29/2022   11:14 AM  Vitals with BMI  Height 6\' 0"     Weight 233 lbs 13 oz    BMI [id number]    Systolic Q000111Q 0000000 Q000111Q  Diastolic 96 92 97  Pulse 74 73 90     Physical Exam Vitals reviewed.  HENT:     Head: Normocephalic and atraumatic.  Cardiovascular:     Rate and Rhythm: Normal rate and regular rhythm.     Pulses: Normal pulses.     Heart sounds: Normal heart sounds.  No murmur heard. Pulmonary:     Effort: Pulmonary effort is normal.     Breath sounds: Normal breath sounds.  Abdominal:     General: Bowel sounds are normal.  Musculoskeletal:     Right lower leg: No edema.     Left lower leg: No edema.  Skin:    General: Skin is warm and dry.  Neurological:     Mental Status: He is alert.     Medications and allergies   Allergies  Allergen Reactions   Bee Venom Anaphylaxis    EPIPEN required   Contrast Media [Iodinated Contrast Media] Nausea Only     Medication list after today's encounter   Current Outpatient Medications:    albuterol (VENTOLIN HFA) 108 (90 Base) MCG/ACT inhaler, Inhale into the lungs every 6 (six) hours as needed for wheezing or shortness of breath., Disp: , Rfl:    alfuzosin (UROXATRAL) 10 MG 24 hr tablet, Take 10 mg by mouth daily with breakfast., Disp:  , Rfl:    amLODipine (NORVASC) 10 MG tablet, Take 1 tablet (10 mg total) by mouth daily., Disp: 30 tablet, Rfl: 3   atorvastatin (LIPITOR) 10 MG tablet, Take 10 mg by mouth every evening. , Disp: , Rfl:    benzonatate (TESSALON) 200 MG capsule, Take 200 mg by mouth 3 (three) times daily as needed for cough., Disp: , Rfl:    Blood Glucose Monitoring Suppl (ACCU-CHEK AVIVA) device, Use as instructed (Patient taking differently: 1 each by Other route daily.), Disp: 1 each, Rfl: 0   cetirizine (ZYRTEC) 10 MG tablet, Take 10 mg by mouth daily., Disp: , Rfl:    clopidogrel (PLAVIX) 75 MG tablet, TAKE ONE TABLET BY MOUTH ONCE DAILY (Patient taking differently: Take 75 mg by mouth daily.), Disp: 90 tablet, Rfl: 3   glipiZIDE (GLUCOTROL) 5 MG tablet, Take 1 tablet (5 mg total) by mouth 2 (two) times daily before a meal., Disp: 60 tablet, Rfl: 2   glucose blood (ACCU-CHEK AVIVA) test strip, Use to test blood glucose 2 times a day. (Patient taking differently: 1 each by Other route in the morning and at bedtime.), Disp: 150 each, Rfl: 3   hydrALAZINE (APRESOLINE) 50 MG tablet, Take 50 mg by mouth 3 (three) times daily., Disp: , Rfl:    isosorbide dinitrate (ISORDIL) 30 MG tablet, Take 1 tablet (30 mg total) by mouth 3 (three) times daily., Disp: 90 tablet, Rfl: 6   JANUVIA 100 MG tablet, Take 100 mg by mouth daily., Disp: , Rfl:    JARDIANCE 25 MG TABS tablet, Take 25 mg by mouth daily., Disp: , Rfl:    levocetirizine (XYZAL) 5 MG tablet, Take 5 mg by mouth daily., Disp: , Rfl:    levothyroxine (SYNTHROID) 125 MCG tablet, Take 125 mcg by mouth daily., Disp: , Rfl:    metFORMIN (GLUCOPHAGE) 500 MG tablet, Take 1 tablet (500 mg total) by mouth 2 (two) times daily with a meal., Disp: 60 tablet, Rfl: 3   metoprolol succinate (TOPROL-XL) 100 MG 24 hr tablet, Take 100 mg by mouth daily. , Disp: , Rfl:    oxybutynin (DITROPAN) 5 MG tablet, Take 5 mg by mouth 3 (three) times daily., Disp: , Rfl:    pantoprazole  (PROTONIX) 40 MG tablet, Take 1 tablet (40 mg total) by mouth daily., Disp: 30 tablet, Rfl: 1   triamcinolone (NASACORT ALLERGY 24HR CHILDREN) 55 MCG/ACT AERO nasal inhaler, Place 2 sprays into the nose daily., Disp: , Rfl:    potassium  chloride (MICRO-K) 10 MEQ CR capsule, Take 1 capsule (10 mEq total) by mouth daily., Disp: 30 capsule, Rfl: 0  Laboratory examination:   Lab Results  Component Value Date   NA 141 06/29/2022   K 3.5 06/29/2022   CO2 25 06/29/2022   GLUCOSE 130 (H) 06/29/2022   BUN 17 06/29/2022   CREATININE 1.76 (H) 06/29/2022   CALCIUM 8.7 (L) 06/29/2022   GFRNONAA 44 (L) 06/29/2022       Latest Ref Rng & Units 06/29/2022    1:47 PM 06/29/2022    3:51 AM 06/28/2022    6:01 PM  CMP  Glucose 70 - 99 mg/dL  130  130   BUN 6 - 20 mg/dL  17  17   Creatinine 0.61 - 1.24 mg/dL  1.76  2.10   Sodium 135 - 145 mmol/L  141  140   Potassium 3.5 - 5.1 mmol/L 3.5  2.9  3.0   Chloride 98 - 111 mmol/L  105  108   CO2 22 - 32 mmol/L  25  24   Calcium 8.9 - 10.3 mg/dL  8.7  8.5   Total Protein 6.5 - 8.1 g/dL  6.7  6.9   Total Bilirubin 0.3 - 1.2 mg/dL  0.6  0.9   Alkaline Phos 38 - 126 U/L  45  48   AST 15 - 41 U/L  25  26   ALT 0 - 44 U/L  23  25       Latest Ref Rng & Units 06/29/2022    3:51 AM 06/28/2022    6:01 PM 11/25/2019    1:39 PM  CBC  WBC 4.0 - 10.5 K/uL 6.9  7.2  8.3   Hemoglobin 13.0 - 17.0 g/dL 12.0  12.3  13.0   Hematocrit 39.0 - 52.0 % 38.1  38.1  40.7   Platelets 150 - 400 K/uL 243  245  289     Lipid Panel No results for input(s): "CHOL", "TRIG", "LDLCALC", "VLDL", "HDL", "CHOLHDL", "LDLDIRECT" in the last 8760 hours.  HEMOGLOBIN A1C Lab Results  Component Value Date   HGBA1C 7.7 (H) 06/29/2022   MPG 174.29 06/29/2022   TSH No results for input(s): "TSH" in the last 8760 hours.  External labs:     Radiology:    Cardiac Studies:   06/29/2022 ECHO IMPRESSIONS   1. Left ventricular ejection fraction, by estimation, is 60 to 65%. The   left ventricle has normal function. The left ventricle has no regional  wall motion abnormalities. There is moderate left ventricular hypertrophy.  Left ventricular diastolic  parameters are consistent with Grade I diastolic dysfunction (impaired  relaxation).   2. Right ventricular systolic function is normal. The right ventricular  size is normal. There is normal pulmonary artery systolic pressure.   3. The mitral valve is normal in structure. No evidence of mitral valve  regurgitation. No evidence of mitral stenosis.   4. The tricuspid valve is abnormal.   5. The aortic valve is tricuspid. Aortic valve regurgitation is not  visualized. No aortic stenosis is present.   6. IVC is small suggesting low RA pressure and hypovolemia.    Coronary angiogram 05/01/2016: Normal LV systolic function, EF 33-35%. No significant coronary artery disease. Right dominant separation. Minimal luminal irregularity evident in the LAD and also RCA. Recommend addition: Eval lesion for noncardiac causes of chest pain is indicated. False-positive stress test.    EKG:   08/31/2021: Sinus Rhythm with poor R wave progression  Assessment     ICD-10-CM   1. Chest pain of uncertain etiology  [id number] EKG 12-Lead    2. Syncope and collapse  R55 LONG TERM MONITOR (3-14 DAYS)    3. Essential hypertension  I10        Orders Placed This Encounter  Procedures   LONG TERM MONITOR (3-14 DAYS)    Standing Status:   Future    Number of Occurrences:   1    Standing Expiration Date:   09/01/2023    Order Specific Question:   Where should this test be performed?    Answer:   [facility]    Order Specific Question:   Does the patient have an implanted cardiac device?    Answer:   No    Order Specific Question:   Prescribed days of wear    Answer:   31    Order Specific Question:   Type of enrollment    Answer:   Clinic Enrollment    Order Specific Question:   Release to patient    Answer:   Immediate   EKG  12-Lead    Meds ordered this encounter  Medications   isosorbide dinitrate (ISORDIL) 30 MG tablet    Sig: Take 1 tablet (30 mg total) by mouth 3 (three) times daily.    Dispense:  90 tablet    Refill:  6    There are no discontinued medications.   Recommendations:   ESHWAR CHRISTO is a 62 y.o.  male with chest pain and hypertension  Chest pain, suspect due to uncontrolled HTN Echocardiogram completed in 06/2022 shows no RWMA Cardiac catheterization in 2017 did not show any coronary disease - unlikely that patient has developed obstructive CAD in last few years Will optimize blood pressure control   Syncope and collapse Etiology sounds orthostatic from dehydration Will obtain event monitor to rule out arrhythmia as the cause Encourage patient to increase water intake and decrease caffeine intake Change positions slowly and consider compression stockings   Essential hypertension Continue current cardiac medications. BP is not well controlled and patient admits it has been high at home too Will add Irordil TID to his current regiment to be taken with hydralazine Patient will monitor his pressures and keep BP log for our next visit Encourage low-sodium diet, less than 2000 mg daily.   Follow-up in 1-2 months or sooner if needed    Floydene Flock, DO, [facility]  09/03/2022, 1:43 PM Office: [phone] Pager: [phone]

## 2022-09-05 ENCOUNTER — Other Ambulatory Visit: Payer: BC Managed Care – PPO

## 2022-09-05 DIAGNOSIS — R55 Syncope and collapse: Secondary | ICD-10-CM

## 2022-09-06 ENCOUNTER — Ambulatory Visit: Payer: BC Managed Care – PPO

## 2022-10-10 ENCOUNTER — Encounter: Payer: Self-pay | Admitting: Internal Medicine

## 2022-10-10 ENCOUNTER — Ambulatory Visit: Payer: BC Managed Care – PPO | Admitting: Internal Medicine

## 2022-10-10 VITALS — BP 122/74 | HR 82 | Ht 72.0 in | Wt 239.3 lb

## 2022-10-10 DIAGNOSIS — E1165 Type 2 diabetes mellitus with hyperglycemia: Secondary | ICD-10-CM

## 2022-10-10 DIAGNOSIS — I10 Essential (primary) hypertension: Secondary | ICD-10-CM

## 2022-10-10 DIAGNOSIS — E782 Mixed hyperlipidemia: Secondary | ICD-10-CM

## 2022-10-10 NOTE — Progress Notes (Unsigned)
Primary Physician/Referring:  Glenda Chroman, MD  Patient ID: Eric Glass, male    DOB: 11-06-1960, 62 y.o.   MRN: [id number]  Chief Complaint  Patient presents with   Chest Pain   Follow-up   Results   HPI:    Eric Glass  is a 62 y.o. with past medical history significant for diabetes and hypertension who is here to establish care with cardiology. Patient has been having shortness of breath and chest pain with activity. His blood pressure is pretty elevated today and he admits it has been running pretty high lately. He states his blood pressure is never <130/80 and he has no idea what it runs with activity. He is agreeable to adding another agent for better blood pressure control. Of note, he had an echocardiogram in November 2023 when he went to the hospital and it did not show any regional wall motion abnormality or reduced ejection fraction. Patient thinks this could be related to his blood pressure since it has been much higher than usual, especially within the last couple of months. He did have a syncopal episode which was unrelated to his other symptoms. He states it happened when he was changing positions and he didn't fully lose consciousness. Patient felt fine after and there was no post-ictal period. He admits to not drinking enough water and will try to be mindful of this. Otherwise, patient has been doing pretty well.   Past Medical History:  Diagnosis Date   Arthritis    Chest pain    CVA (cerebral infarction)    Diabetes mellitus without complication ([place])    Headache    Hypertension    Neck pain    Stroke [facility])    Past Surgical History:  Procedure Laterality Date   CARDIAC CATHETERIZATION N/A 05/01/2016   Procedure: Left Heart Cath and Coronary Angiography;  Surgeon: Adrian Prows, MD;  Location: [place] [facility];  Service: Cardiovascular;  Laterality: N/A;   rotator cuff replacement Left    Family History  Problem Relation Age of Onset   Stroke Father    Heart  disease Father    Seizures Paternal Uncle     Social History   Tobacco Use   Smoking status: Former    Types: Cigarettes    Quit date: 02/09/2015    Years since quitting: 7.6   Smokeless tobacco: Never  Substance Use Topics   Alcohol use: No    Alcohol/week: 0.0 standard drinks of alcohol   Marital Status: Married  ROS  Review of Systems  Cardiovascular:  Positive for chest pain, dyspnea on exertion and syncope.  Respiratory:  Positive for shortness of breath.    Objective  Blood pressure 122/74, pulse 82, height 6' (1.829 m), weight 239 lb 4.8 oz (108.5 kg), SpO2 98 %. Body mass index is 32.45 kg/m.     10/10/2022    2:47 PM 08/31/2022    8:58 AM 06/29/2022   12:13 PM  Vitals with BMI  Height 6' 0"$  6' 0"$    Weight 239 lbs 5 oz 233 lbs 13 oz   BMI [id number]   Systolic 123XX123 Q000111Q 0000000  Diastolic 74 96 92  Pulse 82 74 73     Physical Exam Vitals reviewed.  HENT:     Head: Normocephalic and atraumatic.  Cardiovascular:     Rate and Rhythm: Normal rate and regular rhythm.     Pulses: Normal pulses.     Heart sounds: Normal heart sounds. No  murmur heard. Pulmonary:     Effort: Pulmonary effort is normal.     Breath sounds: Normal breath sounds.  Abdominal:     General: Bowel sounds are normal.  Musculoskeletal:     Right lower leg: No edema.     Left lower leg: No edema.  Skin:    General: Skin is warm and dry.  Neurological:     Mental Status: He is alert.     Medications and allergies   Allergies  Allergen Reactions   Bee Venom Anaphylaxis    EPIPEN required   Contrast Media [Iodinated Contrast Media] Nausea Only     Medication list after today's encounter   Current Outpatient Medications:    albuterol (VENTOLIN HFA) 108 (90 Base) MCG/ACT inhaler, Inhale into the lungs every 6 (six) hours as needed for wheezing or shortness of breath., Disp: , Rfl:    alfuzosin (UROXATRAL) 10 MG 24 hr tablet, Take 10 mg by mouth daily with breakfast., Disp: , Rfl:     amLODipine (NORVASC) 10 MG tablet, Take 1 tablet (10 mg total) by mouth daily., Disp: 30 tablet, Rfl: 3   atorvastatin (LIPITOR) 10 MG tablet, Take 10 mg by mouth every evening. , Disp: , Rfl:    Blood Glucose Monitoring Suppl (ACCU-CHEK AVIVA) device, Use as instructed (Patient taking differently: 1 each by Other route daily.), Disp: 1 each, Rfl: 0   cetirizine (ZYRTEC) 10 MG tablet, Take 10 mg by mouth daily., Disp: , Rfl:    clopidogrel (PLAVIX) 75 MG tablet, TAKE ONE TABLET BY MOUTH ONCE DAILY (Patient taking differently: Take 75 mg by mouth daily.), Disp: 90 tablet, Rfl: 3   glipiZIDE (GLUCOTROL) 5 MG tablet, Take 1 tablet (5 mg total) by mouth 2 (two) times daily before a meal., Disp: 60 tablet, Rfl: 2   glucose blood (ACCU-CHEK AVIVA) test strip, Use to test blood glucose 2 times a day. (Patient taking differently: 1 each by Other route in the morning and at bedtime.), Disp: 150 each, Rfl: 3   hydrALAZINE (APRESOLINE) 50 MG tablet, Take 50 mg by mouth 3 (three) times daily., Disp: , Rfl:    isosorbide dinitrate (ISORDIL) 30 MG tablet, Take 1 tablet (30 mg total) by mouth 3 (three) times daily., Disp: 90 tablet, Rfl: 6   JANUVIA 100 MG tablet, Take 100 mg by mouth daily., Disp: , Rfl:    JARDIANCE 25 MG TABS tablet, Take 25 mg by mouth daily., Disp: , Rfl:    levocetirizine (XYZAL) 5 MG tablet, Take 5 mg by mouth daily., Disp: , Rfl:    levothyroxine (SYNTHROID) 125 MCG tablet, Take 125 mcg by mouth daily., Disp: , Rfl:    metFORMIN (GLUCOPHAGE) 500 MG tablet, Take 1 tablet (500 mg total) by mouth 2 (two) times daily with a meal., Disp: 60 tablet, Rfl: 3   metoprolol succinate (TOPROL-XL) 100 MG 24 hr tablet, Take 100 mg by mouth daily. , Disp: , Rfl:    oxybutynin (DITROPAN) 5 MG tablet, Take 5 mg by mouth 3 (three) times daily., Disp: , Rfl:    pantoprazole (PROTONIX) 40 MG tablet, Take 1 tablet (40 mg total) by mouth daily., Disp: 30 tablet, Rfl: 1   potassium chloride (MICRO-K) 10 MEQ CR  capsule, Take 1 capsule (10 mEq total) by mouth daily., Disp: 30 capsule, Rfl: 0   triamcinolone (NASACORT ALLERGY 24HR CHILDREN) 55 MCG/ACT AERO nasal inhaler, Place 2 sprays into the nose daily., Disp: , Rfl:   Laboratory examination:  Lab Results  Component Value Date   NA 141 06/29/2022   K 3.5 06/29/2022   CO2 25 06/29/2022   GLUCOSE 130 (H) 06/29/2022   BUN 17 06/29/2022   CREATININE 1.76 (H) 06/29/2022   CALCIUM 8.7 (L) 06/29/2022   GFRNONAA 44 (L) 06/29/2022       Latest Ref Rng & Units 06/29/2022    1:47 PM 06/29/2022    3:51 AM 06/28/2022    6:01 PM  CMP  Glucose 70 - 99 mg/dL  130  130   BUN 6 - 20 mg/dL  17  17   Creatinine 0.61 - 1.24 mg/dL  1.76  2.10   Sodium 135 - 145 mmol/L  141  140   Potassium 3.5 - 5.1 mmol/L 3.5  2.9  3.0   Chloride 98 - 111 mmol/L  105  108   CO2 22 - 32 mmol/L  25  24   Calcium 8.9 - 10.3 mg/dL  8.7  8.5   Total Protein 6.5 - 8.1 g/dL  6.7  6.9   Total Bilirubin 0.3 - 1.2 mg/dL  0.6  0.9   Alkaline Phos 38 - 126 U/L  45  48   AST 15 - 41 U/L  25  26   ALT 0 - 44 U/L  23  25       Latest Ref Rng & Units 06/29/2022    3:51 AM 06/28/2022    6:01 PM 11/25/2019    1:39 PM  CBC  WBC 4.0 - 10.5 K/uL 6.9  7.2  8.3   Hemoglobin 13.0 - 17.0 g/dL 12.0  12.3  13.0   Hematocrit 39.0 - 52.0 % 38.1  38.1  40.7   Platelets 150 - 400 K/uL 243  245  289     Lipid Panel No results for input(s): "CHOL", "TRIG", "LDLCALC", "VLDL", "HDL", "CHOLHDL", "LDLDIRECT" in the last 8760 hours.  HEMOGLOBIN A1C Lab Results  Component Value Date   HGBA1C 7.7 (H) 06/29/2022   MPG 174.29 06/29/2022   TSH No results for input(s): "TSH" in the last 8760 hours.  External labs:     Radiology:    Cardiac Studies:   06/29/2022 ECHO IMPRESSIONS   1. Left ventricular ejection fraction, by estimation, is 60 to 65%. The  left ventricle has normal function. The left ventricle has no regional  wall motion abnormalities. There is moderate left ventricular  hypertrophy.  Left ventricular diastolic  parameters are consistent with Grade I diastolic dysfunction (impaired  relaxation).   2. Right ventricular systolic function is normal. The right ventricular  size is normal. There is normal pulmonary artery systolic pressure.   3. The mitral valve is normal in structure. No evidence of mitral valve  regurgitation. No evidence of mitral stenosis.   4. The tricuspid valve is abnormal.   5. The aortic valve is tricuspid. Aortic valve regurgitation is not  visualized. No aortic stenosis is present.   6. IVC is small suggesting low RA pressure and hypovolemia.    Coronary angiogram 05/01/2016: Normal LV systolic function, EF 0000000. No significant coronary artery disease. Right dominant separation. Minimal luminal irregularity evident in the LAD and also RCA. Recommend addition: Eval lesion for noncardiac causes of chest pain is indicated. False-positive stress test.   Zio Patch Extended Out Patient EKG monitoring 13 days starting 09/06/2022:   Dominant rhythm sinus rhythm. HR 58-194 bpm. Avg HR 78 bpm. 4 episodes of SVT, fastest at 194 bpm for 4 beats, longest for  17.3 secs at 120 bpm. 0 episodes of VT Isolated SVE <1.0%, Couplet SVE <1.0%, Triplet SVE 0 Isolated VE <1.0%, Couplet VE 0, Triplet VE 0 No atrial fibrillation/atrial flutter/SVT/VT/high grade AV block, sinus pause >3sec noted.   EKG:   08/31/2021: Sinus Rhythm with poor R wave progression  Assessment     ICD-10-CM   1. Essential hypertension  I10     2. Uncontrolled type 2 diabetes mellitus with hyperglycemia (HCC)  E11.65     3. Mixed hyperlipidemia  E78.2        No orders of the defined types were placed in this encounter.   No orders of the defined types were placed in this encounter.   Medications Discontinued During This Encounter  Medication Reason   benzonatate (TESSALON) 200 MG capsule Completed Course     Recommendations:   Eric Glass is a 62 y.o.   male with hypertension, hyperlipidemia, and diabetes   Essential hypertension Continue current cardiac medications. BP is not well controlled and patient admits it has been high at home too Will add Irordil TID to his current regiment to be taken with hydralazine Patient will monitor his pressures and keep BP log for our next visit Encourage low-sodium diet, less than 2000 mg daily.   Hyperlipidemia Etiology sounds orthostatic from dehydration Will obtain event monitor to rule out arrhythmia as the cause Encourage patient to increase water intake and decrease caffeine intake Change positions slowly and consider compression stockings   Uncontrolled type 2 diabetes mellitus with hyperglycemia (HCC) Echocardiogram completed in 06/2022 shows no RWMA Cardiac catheterization in 2017 did not show any coronary disease - unlikely that patient has developed obstructive CAD in last few years Will optimize blood pressure control      Floydene Flock, DO, [facility]  10/10/2022, 2:57 PM Office: [phone] Pager: [phone]

## 2023-02-28 ENCOUNTER — Other Ambulatory Visit: Payer: Self-pay | Admitting: Internal Medicine

## 2023-03-28 ENCOUNTER — Other Ambulatory Visit: Payer: Self-pay | Admitting: Cardiology

## 2023-04-10 ENCOUNTER — Ambulatory Visit: Payer: BC Managed Care – PPO | Admitting: Internal Medicine

## 2023-04-11 NOTE — Progress Notes (Signed)
Follow up visit  Subjective:   Eric Glass, male    DOB: 1961/01/09, 62 y.o.   MRN: [id number]     HPI  Chief Complaint  Patient presents with   Hypertension   Follow-up    6 months    62 y.o. 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  He denies any complaints of chest pain or shortness of breath.  Blood pressure is elevated today, but at home it ranges 120-130/70-80 according to him.  Historically, he has had hypokalemia.  It does not appear that he has had any workup for hyperaldosteronism, to my knowledge.  He quit smoking after his stroke in 2016.  He has known OSA, but currently not using CPAP for last 2 years.      Current Outpatient Medications:    albuterol (VENTOLIN HFA) 108 (90 Base) MCG/ACT inhaler, Inhale into the lungs every 6 (six) hours as needed for wheezing or shortness of breath., Disp: , Rfl:    alfuzosin (UROXATRAL) 10 MG 24 hr tablet, Take 10 mg by mouth daily with breakfast., Disp: , Rfl:    amLODipine (NORVASC) 10 MG tablet, Take 1 tablet (10 mg total) by mouth daily., Disp: 30 tablet, Rfl: 3   atorvastatin (LIPITOR) 10 MG tablet, Take 10 mg by mouth every evening. , Disp: , Rfl:    Blood Glucose Monitoring Suppl (ACCU-CHEK AVIVA) device, Use as instructed (Patient taking differently: 1 each by Other route daily.), Disp: 1 each, Rfl: 0   cetirizine (ZYRTEC) 10 MG tablet, Take 10 mg by mouth daily., Disp: , Rfl:    clopidogrel (PLAVIX) 75 MG tablet, TAKE ONE TABLET BY MOUTH ONCE DAILY (Patient taking differently: Take 75 mg by mouth daily.), Disp: 90 tablet, Rfl: 3   glipiZIDE (GLUCOTROL) 5 MG tablet, Take 1 tablet (5 mg total) by mouth 2 (two) times daily before a meal., Disp: 60 tablet, Rfl: 2   glucose blood (ACCU-CHEK AVIVA) test strip, Use to test blood glucose 2 times a day. (Patient taking differently: 1 each by  Other route in the morning and at bedtime.), Disp: 150 each, Rfl: 3   hydrALAZINE (APRESOLINE) 50 MG tablet, Take 50 mg by mouth 3 (three) times daily., Disp: , Rfl:    isosorbide dinitrate (ISORDIL) 30 MG tablet, TAKE 1 TABLET BY MOUTH THREE TIMES DAILY, Disp: 90 tablet, Rfl: 0   JANUVIA 100 MG tablet, Take 100 mg by mouth daily., Disp: , Rfl:    JARDIANCE 25 MG TABS tablet, Take 25 mg by mouth daily., Disp: , Rfl:    levocetirizine (XYZAL) 5 MG tablet, Take 5 mg by mouth daily., Disp: , Rfl:    levothyroxine (SYNTHROID) 125 MCG tablet, Take 125 mcg by mouth daily., Disp: , Rfl:    metFORMIN (GLUCOPHAGE) 500 MG tablet, Take 1 tablet (500 mg total) by mouth 2 (two) times daily with a meal., Disp: 60 tablet, Rfl: 3   metoprolol succinate (TOPROL-XL) 100 MG 24 hr tablet, Take 100 mg by mouth daily. , Disp: , Rfl:    oxybutynin (DITROPAN) 5 MG tablet, Take 5 mg by mouth 3 (three) times daily., Disp: , Rfl:    pantoprazole (PROTONIX) 40 MG tablet, Take 1 tablet (40 mg total) by mouth daily., Disp: 30 tablet, Rfl: 1   potassium chloride (MICRO-K) 10 MEQ CR capsule, Take 1 capsule (10  mEq total) by mouth daily., Disp: 30 capsule, Rfl: 0   triamcinolone (NASACORT ALLERGY 24HR CHILDREN) 55 MCG/ACT AERO nasal inhaler, Place 2 sprays into the nose daily., Disp: , Rfl:    Cardiovascular & other pertient studies:  Reviewed external labs and tests, independently interpreted  EKG 04/12/2023: Sinus rhythm 69 bpm Poor R wave progression otherwise normal EKG   Zio Patch Extended Out Patient EKG monitoring 13 days starting 09/06/2022:   Dominant rhythm sinus rhythm. HR 58-194 bpm. Avg HR 78 bpm. 4 episodes of SVT, fastest at 194 bpm for 4 beats, longest for 17.3 secs at 120 bpm. 0 episodes of VT Isolated SVE <1.0%, Couplet SVE <1.0%, Triplet SVE 0 Isolated VE <1.0%, Couplet VE 0, Triplet VE 0 No atrial fibrillation/atrial flutter/SVT/VT/high grade AV block, sinus pause >3sec noted.   Echocardiogram  06/29/2022: 1. Left ventricular ejection fraction, by estimation, is 60 to 65%. The  left ventricle has normal function. The left ventricle has no regional  wall motion abnormalities. There is moderate left ventricular hypertrophy.  Left ventricular diastolic  parameters are consistent with Grade I diastolic dysfunction (impaired  relaxation).   2. Right ventricular systolic function is normal. The right ventricular  size is normal. There is normal pulmonary artery systolic pressure.   3. The mitral valve is normal in structure. No evidence of mitral valve  regurgitation. No evidence of mitral stenosis.   4. The tricuspid valve is abnormal.   5. The aortic valve is tricuspid. Aortic valve regurgitation is not  visualized. No aortic stenosis is present.   6. IVC is small suggesting low RA pressure and hypovolemia.    Recent labs: 06/29/2022: Glucose 130, BUN/Cr 17/1.76. EGFR 44. Na/K 141/2/9. Rest of the CMP normal H/H 12/38. MCV 83. Platelets 242 HbA1C 7.7% TSH 11.5 high    Review of Systems  Cardiovascular:  Negative for chest pain, dyspnea on exertion, leg swelling, palpitations and syncope.         Vitals:   04/12/23 0845  BP: (!) 176/108  Pulse: 69  Resp: 16  SpO2: 98%    Body mass index is 31.87 kg/m. Filed Weights   04/12/23 0845  Weight: 235 lb (106.6 kg)     Objective:   Physical Exam Vitals and nursing note reviewed.  Constitutional:      General: He is not in acute distress. Neck:     Vascular: No JVD.  Cardiovascular:     Rate and Rhythm: Normal rate and regular rhythm.     Heart sounds: Normal heart sounds. No murmur heard. Pulmonary:     Effort: Pulmonary effort is normal.     Breath sounds: Normal breath sounds. No wheezing or rales.  Musculoskeletal:     Right lower leg: No edema.     Left lower leg: No edema.             Visit diagnoses:   ICD-10-CM   1. Essential hypertension  I10 EKG 12-Lead    Aldosterone + renin activity  w/ ratio    Basic metabolic panel    Lipid panel    Ambulatory referral to Sleep Studies    2. Mixed hyperlipidemia  E78.2     3. Hypokalemia  E87.6        Orders Placed This Encounter  Procedures   Aldosterone + renin activity w/ ratio   Basic metabolic panel   Lipid panel   Ambulatory referral to Sleep Studies   EKG 12-Lead      Assessment &  Recommendations:    62 y.o. African American male with hypertension, hyperlipidemia, type 2 DM, CKD 3B, h/o stroke 2016, OSA not on CPAP  Hypertension: Blood pressure elevated today, reportedly normal at home. Concern for secondary hypertension in the setting of historically low potassium, will check renin/aldosterone, along with BMP. Also referred to sleep study, as he has known OSA, but not on CPAP . Reportedly, patient has not taken his medications this morning.  Usually, he takes them at 5 AM.  Have not made any changes in absence of any symptoms.  However, I have encouraged the patient to check blood pressure regularly at home and keep a blood pressure log.  I will see him back in 2 weeks with his blood pressure log, blood pressure monitor, and discuss results of upcoming tests. Continue current antihypertensive therapy, including amlodipine 10 mg daily, metoprolol succinate 100 mg daily, hydralazine 50 mg 3 a day, Isordil 30 mg 3 times daily.  Depending on the results of renin/aldosterone, and BMP, potentially I will add spironolactone.     Elder Negus, MD Pager: [phone] Office: [phone]

## 2023-04-12 ENCOUNTER — Ambulatory Visit: Payer: BC Managed Care – PPO | Admitting: Cardiology

## 2023-04-12 ENCOUNTER — Encounter: Payer: Self-pay | Admitting: Cardiology

## 2023-04-12 VITALS — BP 168/100 | HR 78 | Resp 16 | Ht 72.0 in | Wt 235.0 lb

## 2023-04-12 DIAGNOSIS — I10 Essential (primary) hypertension: Secondary | ICD-10-CM

## 2023-04-12 DIAGNOSIS — E782 Mixed hyperlipidemia: Secondary | ICD-10-CM

## 2023-04-12 DIAGNOSIS — E876 Hypokalemia: Secondary | ICD-10-CM

## 2023-04-24 LAB — LIPID PANEL
Chol/HDL Ratio: 4.8 ratio (ref 0.0–5.0)
Cholesterol, Total: 140 mg/dL (ref 100–199)
HDL: 29 mg/dL — ABNORMAL LOW (ref >39)
LDL Chol Calc (NIH): 84 mg/dL (ref 0–99)
Triglycerides: 151 mg/dL — ABNORMAL HIGH (ref 0–149)
VLDL Cholesterol Cal: 27 mg/dL (ref 5–40)

## 2023-04-24 LAB — BASIC METABOLIC PANEL
BUN/Creatinine Ratio: 8 — ABNORMAL LOW (ref 10–24)
BUN: 12 mg/dL (ref 8–27)
CO2: 24 mmol/L (ref 20–29)
Calcium: 9.8 mg/dL (ref 8.6–10.2)
Chloride: 103 mmol/L (ref 96–106)
Creatinine, Ser: 1.45 mg/dL — ABNORMAL HIGH (ref 0.76–1.27)
Glucose: 128 mg/dL — ABNORMAL HIGH (ref 70–99)
Potassium: 4.1 mmol/L (ref 3.5–5.2)
Sodium: 141 mmol/L (ref 134–144)
eGFR: 55 mL/min/{1.73_m2} — ABNORMAL LOW (ref >59)

## 2023-04-24 LAB — ALDOSTERONE + RENIN ACTIVITY W/ RATIO
Aldos/Renin Ratio: 15.3 (ref 0.0–30.0)
Aldosterone: 10 ng/dL (ref 0.0–30.0)
Renin Activity, Plasma: 0.653 ng/mL/hr (ref 0.167–5.380)

## 2023-04-26 ENCOUNTER — Ambulatory Visit: Payer: BC Managed Care – PPO | Admitting: Cardiology

## 2023-04-26 ENCOUNTER — Other Ambulatory Visit: Payer: Self-pay | Admitting: Cardiology

## 2023-04-26 ENCOUNTER — Encounter: Payer: Self-pay | Admitting: Cardiology

## 2023-04-26 VITALS — BP 137/77 | HR 75 | Resp 16 | Ht 72.0 in | Wt 236.0 lb

## 2023-04-26 DIAGNOSIS — I10 Essential (primary) hypertension: Secondary | ICD-10-CM

## 2023-04-26 NOTE — Progress Notes (Signed)
Follow up visit  Subjective:   Eric Glass, male    DOB: 10-19-1960, 62 y.o.   MRN: [id number]     HPI  Chief Complaint  Patient presents with   Hypertension   Follow-up    62 y.o. African American male with hypertension, hyperlipidemia, type 2 DM, CKD 3B, h/o stroke 2016, OSA not on CPAP   Blood pressure well-controlled today. Reviewed recent test results with the patient, details below.      Current Outpatient Medications:    albuterol (VENTOLIN HFA) 108 (90 Base) MCG/ACT inhaler, Inhale into the lungs every 6 (six) hours as needed for wheezing or shortness of breath., Disp: , Rfl:    alfuzosin (UROXATRAL) 10 MG 24 hr tablet, Take 10 mg by mouth daily with breakfast., Disp: , Rfl:    amLODipine (NORVASC) 10 MG tablet, Take 1 tablet (10 mg total) by mouth daily., Disp: 30 tablet, Rfl: 3   atorvastatin (LIPITOR) 10 MG tablet, Take 10 mg by mouth every evening. , Disp: , Rfl:    Blood Glucose Monitoring Suppl (ACCU-CHEK AVIVA) device, Use as instructed (Patient taking differently: 1 each by Other route daily.), Disp: 1 each, Rfl: 0   cetirizine (ZYRTEC) 10 MG tablet, Take 10 mg by mouth daily., Disp: , Rfl:    clopidogrel (PLAVIX) 75 MG tablet, TAKE ONE TABLET BY MOUTH ONCE DAILY (Patient taking differently: Take 75 mg by mouth daily.), Disp: 90 tablet, Rfl: 3   glipiZIDE (GLUCOTROL) 5 MG tablet, Take 1 tablet (5 mg total) by mouth 2 (two) times daily before a meal., Disp: 60 tablet, Rfl: 2   glucose blood (ACCU-CHEK AVIVA) test strip, Use to test blood glucose 2 times a day. (Patient taking differently: 1 each by Other route in the morning and at bedtime.), Disp: 150 each, Rfl: 3   hydrALAZINE (APRESOLINE) 50 MG tablet, Take 50 mg by mouth 3 (three) times daily., Disp: , Rfl:    isosorbide dinitrate (ISORDIL) 30 MG tablet, TAKE 1 TABLET BY MOUTH THREE TIMES DAILY, Disp: 90 tablet, Rfl: 0   JANUVIA 100 MG tablet, Take 100 mg by mouth daily., Disp: , Rfl:    JARDIANCE 25 MG  TABS tablet, Take 25 mg by mouth daily., Disp: , Rfl:    levocetirizine (XYZAL) 5 MG tablet, Take 5 mg by mouth daily., Disp: , Rfl:    levothyroxine (SYNTHROID) 150 MCG tablet, Take 150 mcg by mouth daily., Disp: , Rfl:    metFORMIN (GLUCOPHAGE) 500 MG tablet, Take 1 tablet (500 mg total) by mouth 2 (two) times daily with a meal., Disp: 60 tablet, Rfl: 3   metoprolol succinate (TOPROL-XL) 100 MG 24 hr tablet, Take 100 mg by mouth daily. , Disp: , Rfl:    oxybutynin (DITROPAN) 5 MG tablet, Take 5 mg by mouth 3 (three) times daily., Disp: , Rfl:    pantoprazole (PROTONIX) 40 MG tablet, Take 1 tablet (40 mg total) by mouth daily., Disp: 30 tablet, Rfl: 1   potassium chloride (MICRO-K) 10 MEQ CR capsule, Take 1 capsule (10 mEq total) by mouth daily., Disp: 30 capsule, Rfl: 0   triamcinolone (NASACORT ALLERGY 24HR CHILDREN) 55 MCG/ACT AERO nasal inhaler, Place 2 sprays into the nose daily., Disp: , Rfl:    Cardiovascular & other pertient studies:  Reviewed external labs and tests, independently interpreted  EKG 04/12/2023: Sinus rhythm 69 bpm Poor R wave progression otherwise normal EKG   Zio Patch Extended Out Patient EKG monitoring 13 days starting 09/06/2022:  Dominant rhythm sinus rhythm. HR 58-194 bpm. Avg HR 78 bpm. 4 episodes of SVT, fastest at 194 bpm for 4 beats, longest for 17.3 secs at 120 bpm. 0 episodes of VT Isolated SVE <1.0%, Couplet SVE <1.0%, Triplet SVE 0 Isolated VE <1.0%, Couplet VE 0, Triplet VE 0 No atrial fibrillation/atrial flutter/SVT/VT/high grade AV block, sinus pause >3sec noted.   Echocardiogram 06/29/2022: 1. Left ventricular ejection fraction, by estimation, is 60 to 65%. The  left ventricle has normal function. The left ventricle has no regional  wall motion abnormalities. There is moderate left ventricular hypertrophy.  Left ventricular diastolic  parameters are consistent with Grade I diastolic dysfunction (impaired  relaxation).   2. Right ventricular  systolic function is normal. The right ventricular  size is normal. There is normal pulmonary artery systolic pressure.   3. The mitral valve is normal in structure. No evidence of mitral valve  regurgitation. No evidence of mitral stenosis.   4. The tricuspid valve is abnormal.   5. The aortic valve is tricuspid. Aortic valve regurgitation is not  visualized. No aortic stenosis is present.   6. IVC is small suggesting low RA pressure and hypovolemia.    Recent labs: 04/12/2023: Glucose 128, BUN/Cr 12/1.45. EGFR 55. Na/K 141/4.1.  Chol 140, TG 151, HDL 29, LDL 84  Latest Reference Range & Units 04/12/23 09:21  ALDOSTERONE 0.0 - 30.0 ng/dL 30.8  ALDOS/RENIN RATIO 0.0 - 30.0  15.3     06/29/2022: Glucose 130, BUN/Cr 17/1.76. EGFR 44. Na/K 141/2/9. Rest of the CMP normal H/H 12/38. MCV 83. Platelets 242 HbA1C 7.7% TSH 11.5 high    Review of Systems  Cardiovascular:  Negative for chest pain, dyspnea on exertion, leg swelling, palpitations and syncope.         Vitals:   04/26/23 1029  BP: 137/77  Pulse: 75  Resp: 16  SpO2: 97%    Body mass index is 32.01 kg/m. Filed Weights   04/26/23 1029  Weight: 236 lb (107 kg)     Objective:   Physical Exam Vitals and nursing note reviewed.  Constitutional:      General: He is not in acute distress. Neck:     Vascular: No JVD.  Cardiovascular:     Rate and Rhythm: Normal rate and regular rhythm.     Heart sounds: Normal heart sounds. No murmur heard. Pulmonary:     Effort: Pulmonary effort is normal.     Breath sounds: Normal breath sounds. No wheezing or rales.  Musculoskeletal:     Right lower leg: No edema.     Left lower leg: No edema.             Visit diagnoses:   ICD-10-CM   1. Essential hypertension  I10         Assessment & Recommendations:    62 y.o. African American male with hypertension, hyperlipidemia, type 2 DM, CKD 3B, h/o stroke 2016, OSA not on CPAP  Hypertension: Blood pressure  well-controlled today. Low suspicion for secondary hypertension at this time. No changes made today.   F/u in 6 months   Elder Negus, MD Pager: [phone] Office: [phone]

## 2023-09-25 ENCOUNTER — Other Ambulatory Visit: Payer: Self-pay | Admitting: Cardiology

## 2023-10-18 ENCOUNTER — Ambulatory Visit: Payer: Self-pay | Admitting: Cardiology

## 2023-10-25 ENCOUNTER — Encounter: Payer: Self-pay | Admitting: Cardiology

## 2023-10-25 ENCOUNTER — Other Ambulatory Visit: Payer: Self-pay | Admitting: Cardiology

## 2023-10-25 ENCOUNTER — Ambulatory Visit: Payer: Self-pay | Attending: Cardiology | Admitting: Cardiology

## 2023-10-25 VITALS — BP 120/72 | HR 79 | Resp 16 | Ht 72.0 in | Wt 238.0 lb

## 2023-10-25 DIAGNOSIS — I10 Essential (primary) hypertension: Secondary | ICD-10-CM

## 2023-10-25 DIAGNOSIS — I471 Supraventricular tachycardia, unspecified: Secondary | ICD-10-CM

## 2023-10-25 DIAGNOSIS — Z79899 Other long term (current) drug therapy: Secondary | ICD-10-CM

## 2023-10-25 DIAGNOSIS — E782 Mixed hyperlipidemia: Secondary | ICD-10-CM | POA: Diagnosis not present

## 2023-10-25 MED ORDER — ATORVASTATIN CALCIUM 40 MG PO TABS: 40.0000 mg | ORAL_TABLET | Freq: Every day | ORAL | 3 refills | Status: DC

## 2023-10-25 NOTE — Patient Instructions (Signed)
 Medication Instructions:  Increase Lipitor 40 mg daily *If you need a refill on your cardiac medications before your next appointment, please call your pharmacy*  Lab Work: Your physician recommends that you return for lab work in June 2025.  Fasting Lipid panel  Testing/Procedures: NONE ordered at this time of appointment   Follow-Up: At [facility], you and your health needs are our priority.  As part of our continuing mission to provide you with exceptional heart care, we have created designated Provider Care Teams.  These Care Teams include your primary Cardiologist (physician) and Advanced Practice Providers (APPs -  Physician Assistants and Nurse Practitioners) who all work together to provide you with the care you need, when you need it.  We recommend signing up for the patient portal called "MyChart".  Sign up information is provided on this After Visit Summary.  MyChart is used to connect with patients for Virtual Visits (Telemedicine).  Patients are able to view lab/test results, encounter notes, upcoming appointments, etc.  Non-urgent messages can be sent to your provider as well.   To learn more about what you can do with MyChart, go to [email or web address].    Your next appointment:   6 month(s)  Provider:   Dr. Arnell Sieving   Other Instructions

## 2023-10-25 NOTE — Progress Notes (Signed)
 Cardiology Office Note:  .   Date:  10/25/2023  ID:  Eric Glass, DOB 1961/07/12, MRN [id number] PCP: Ignatius Specking, MD  [facility] Providers Cardiologist:  Truett Mainland, MD PCP: Ignatius Specking, MD  Chief Complaint  Patient presents with   Hypertension        Follow-up    6 month      History of Present Illness: .    Eric Glass is a 63 y.o. male with hypertension, hyperlipidemia, type 2 DM, CKD 3B, h/o stroke 2016, 2020, OSA not on CPAP, pSVT  Patient denies any chest pain shortness of breath.  He has occasional leg edema towards the end of the day.  He stays on his feet a lot at work.  Blood pressure is well-controlled.  Reviewed recent cardiac tests including echocardiogram, monitor with the patient, right chest    Vitals:   10/25/23 0932  BP: 120/72  Pulse: 79  Resp: 16  SpO2: 97%     ROS:  Review of Systems  Cardiovascular:  Positive for leg swelling. Negative for chest pain, dyspnea on exertion, palpitations and syncope.     Studies Reviewed: Marland Kitchen         Independently interpreted 03/2023: Chol 140, TG 151, HDL 29, LDL 84 HbA1C 7.5% Hb 12.8 Cr 1.45, K 4.1   Zio Patch Extended Out Patient EKG monitoring 13 days starting 09/06/2022:   Dominant rhythm sinus rhythm. HR 58-194 bpm. Avg HR 78 bpm. 4 episodes of SVT, fastest at 194 bpm for 4 beats, longest for 17.3 secs at 120 bpm. 0 episodes of VT Isolated SVE <1.0%, Couplet SVE <1.0%, Triplet SVE 0 Isolated VE <1.0%, Couplet VE 0, Triplet VE 0 No atrial fibrillation/atrial flutter/SVT/VT/high grade AV block, sinus pause >3sec noted.   Zio Patch Extended Out Patient EKG monitoring 13 days starting 09/06/2022:   Dominant rhythm sinus rhythm. HR 58-194 bpm. Avg HR 78 bpm. 4 episodes of SVT, fastest at 194 bpm for 4 beats, longest for 17.3 secs at 120 bpm. 0 episodes of VT Isolated SVE <1.0%, Couplet SVE <1.0%, Triplet SVE 0 Isolated VE <1.0%, Couplet VE 0, Triplet VE 0 No atrial  fibrillation/atrial flutter/SVT/VT/high grade AV block, sinus pause >3sec noted.   Echocardiogram 06/29/2022: 1. Left ventricular ejection fraction, by estimation, is 60 to 65%. The  left ventricle has normal function. The left ventricle has no regional  wall motion abnormalities. There is moderate left ventricular hypertrophy.  Left ventricular diastolic  parameters are consistent with Grade I diastolic dysfunction (impaired  relaxation).   2. Right ventricular systolic function is normal. The right ventricular  size is normal. There is normal pulmonary artery systolic pressure.   3. The mitral valve is normal in structure. No evidence of mitral valve  regurgitation. No evidence of mitral stenosis.   4. The tricuspid valve is abnormal.   5. The aortic valve is tricuspid. Aortic valve regurgitation is not  visualized. No aortic stenosis is present.   6. IVC is small suggesting low RA pressure and hypovolemia.   Coronary angiogram 05/01/2016: Normal LV systolic function, EF 55-60%. No significant coronary artery disease. Right dominant separation. Minimal luminal irregularity evident in the LAD and also RCA.      Physical Exam:   Physical Exam Vitals and nursing note reviewed.  Constitutional:      General: He is not in acute distress. Neck:     Vascular: No JVD.  Cardiovascular:     Rate and Rhythm:  Normal rate and regular rhythm.     Heart sounds: Normal heart sounds. No murmur heard. Pulmonary:     Effort: Pulmonary effort is normal.     Breath sounds: Normal breath sounds. No wheezing or rales.  Musculoskeletal:     Right lower leg: No edema.     Left lower leg: No edema.      VISIT DIAGNOSES:   ICD-10-CM   1. Essential hypertension  I10 Lipid panel    Lipid panel    2. Medication management  Z79.899 Lipid panel    Lipid panel    3. Mixed hyperlipidemia  E78.2     4. PSVT (paroxysmal supraventricular tachycardia) (HCC)  I47.10        ASSESSMENT AND PLAN: .     Eric Glass is a 63 y.o. male with hypertension, hyperlipidemia, type 2 DM, CKD 3B, h/o stroke 2016, 2020, OSA not on CPAP, pSVT   Hypertension: Well-controlled on current antihypertensive therapy.  No changes made today.  Leg edema: Not noted on exam today, is present towards the end of the day. I suspect this is due to combination of amlodipine 10 mg daily and long periods of standing. Recommend wearing compression stockings, leg elevation at night.  Mixed hyperlipidemia: In the setting of prior stroke, recommend LDL goal <55. Increase Lipitor to 40 mg daily. Repeat blood panel in 3 months.  PSVT: Noted on monitor.  No symptoms at this time.  Continue metoprolol succinate 100 mg daily.        Meds ordered this encounter  Medications   atorvastatin (LIPITOR) 40 MG tablet    Sig: Take 1 tablet (40 mg total) by mouth daily.    Dispense:  90 tablet    Refill:  3     F/u in 6 months  Signed, Elder Negus, MD

## 2024-02-11 ENCOUNTER — Ambulatory Visit: Payer: Self-pay | Admitting: Cardiology

## 2024-02-11 LAB — LIPID PANEL
Chol/HDL Ratio: 5.2 ratio — ABNORMAL HIGH (ref 0.0–5.0)
Cholesterol, Total: 130 mg/dL (ref 100–199)
HDL: 25 mg/dL — ABNORMAL LOW (ref >39)
LDL Chol Calc (NIH): 73 mg/dL (ref 0–99)
Triglycerides: 190 mg/dL — ABNORMAL HIGH (ref 0–149)
VLDL Cholesterol Cal: 32 mg/dL (ref 5–40)

## 2024-02-11 NOTE — Progress Notes (Signed)
 LDL down from 84 to 73. Is he taking Lipitor 40 mg? If yes, I would recommend increasing to 80 mg to bring LDL even lower.  Thanks MJP

## 2024-02-12 MED ORDER — ROSUVASTATIN CALCIUM 10 MG PO TABS: ORAL_TABLET | ORAL | 3 refills | Status: DC

## 2024-02-12 NOTE — Progress Notes (Signed)
 Options: Continue Lipitor 10, add Zetia 10 Or Change to Crestor 10, increase to 20 as tolerated.  Thanks MJP

## 2024-02-12 NOTE — Telephone Encounter (Signed)
Patient returned RN's call regarding lab results.

## 2024-02-12 NOTE — Telephone Encounter (Signed)
 Pt returning call, requesting cb

## 2024-02-14 MED ORDER — ROSUVASTATIN CALCIUM 20 MG PO TABS: ORAL_TABLET | ORAL | 3 refills | Status: AC

## 2024-02-14 NOTE — Telephone Encounter (Signed)
 Patient is following up. He says [employer] informed him that his insurance will not cover Crestor the way it's written.

## 2024-04-24 ENCOUNTER — Other Ambulatory Visit (HOSPITAL_COMMUNITY): Payer: Self-pay

## 2024-04-24 ENCOUNTER — Ambulatory Visit: Attending: Cardiology | Admitting: Cardiology

## 2024-04-24 ENCOUNTER — Encounter: Payer: Self-pay | Admitting: Cardiology

## 2024-04-24 VITALS — BP 126/70 | HR 76 | Ht 72.0 in | Wt 230.4 lb

## 2024-04-24 DIAGNOSIS — I63311 Cerebral infarction due to thrombosis of right middle cerebral artery: Secondary | ICD-10-CM | POA: Diagnosis not present

## 2024-04-24 DIAGNOSIS — T466X5D Adverse effect of antihyperlipidemic and antiarteriosclerotic drugs, subsequent encounter: Secondary | ICD-10-CM

## 2024-04-24 DIAGNOSIS — E782 Mixed hyperlipidemia: Secondary | ICD-10-CM

## 2024-04-24 DIAGNOSIS — I10 Essential (primary) hypertension: Secondary | ICD-10-CM

## 2024-04-24 DIAGNOSIS — I471 Supraventricular tachycardia, unspecified: Secondary | ICD-10-CM | POA: Insufficient documentation

## 2024-04-24 DIAGNOSIS — R0601 Orthopnea: Secondary | ICD-10-CM | POA: Insufficient documentation

## 2024-04-24 DIAGNOSIS — M791 Myalgia, unspecified site: Secondary | ICD-10-CM | POA: Insufficient documentation

## 2024-04-24 MED ORDER — NEXLIZET 180-10 MG PO TABS
1.0000 | ORAL_TABLET | Freq: Every day | ORAL | 3 refills | Status: DC
  Filled 2024-04-24: qty 30, 30d supply, fill #0

## 2024-04-24 NOTE — Patient Instructions (Addendum)
 Medication Instructions:  STOP Crestor    START Nexlizet  180-10 mg   *If you need a refill on your cardiac medications before your next appointment, please call your pharmacy*  Testing/Procedures: Echocardiogram  Your physician has requested that you have an echocardiogram. Echocardiography is a painless test that uses sound waves to create images of your heart. It provides your doctor with information about the size and shape of your heart and how well your heart's chambers and valves are working. This procedure takes approximately one hour. There are no restrictions for this procedure. Please do NOT wear cologne, perfume, aftershave, or lotions (deodorant is allowed). Please arrive 15 minutes prior to your appointment time.  Please note: We ask at that you not bring children with you during ultrasound (echo/ vascular) testing. Due to room size and safety concerns, children are not allowed in the ultrasound rooms during exams. Our front office staff cannot provide observation of children in our lobby area while testing is being conducted. An adult accompanying a patient to their appointment will only be allowed in the ultrasound room at the discretion of the ultrasound technician under special circumstances. We apologize for any inconvenience.   Follow-Up: At [facility], you and your health needs are our priority.  As part of our continuing mission to provide you with exceptional heart care, our providers are all part of one team.  This team includes your primary Cardiologist (physician) and Advanced Practice Providers or APPs (Physician Assistants and Nurse Practitioners) who all work together to provide you with the care you need, when you need it.  Your next appointment:   3 month(s)  Provider:   Newman JINNY Lawrence, MD

## 2024-04-24 NOTE — Progress Notes (Signed)
 Cardiology Office Note:  .   Date:  04/24/2024  ID:  Eric Glass, DOB 07-30-1961, MRN [id number] PCP: Rosamond Leta NOVAK, MD  [facility] Providers Cardiologist:  Newman Lawrence, MD PCP: Rosamond Leta NOVAK, MD  Chief Complaint  Patient presents with   Myalgia   Erectile Dysfunction      History of Present Illness: .    Eric Glass is a 63 y.o. male with hypertension, hyperlipidemia, type 2 DM, CKD 3B, h/o stroke 2016, 2020, OSA not on CPAP, asthma, pSVT  After increasing Lipitor dose to 40 mg daily, patient had diarrhea, therefore we will switch to rosuvastatin  20 mg daily.  However, he is having severe myalgias, as well as erectile dysfunction since that change.  Separately, he reports shortness of breath usually at night, not so much with exertion.  Vitals:   04/24/24 0918  BP: 126/70  Pulse: 76  SpO2: 96%      ROS:  Review of Systems  Cardiovascular:  Positive for orthopnea. Negative for chest pain, dyspnea on exertion, leg swelling, palpitations and syncope.  Musculoskeletal:  Positive for myalgias.     Studies Reviewed: SABRA       Labs 01/2024: Chol 130, TG 190, HDL 25, LDL 73  03/2023: Chol 140, TG 151, HDL 29, LDL 84 HbA1C 7.5% Hb 12.8 Cr 1.45, K 4.1   Zio Patch Extended Out Patient EKG monitoring 13 days starting 09/06/2022:   Dominant rhythm sinus rhythm. HR 58-194 bpm. Avg HR 78 bpm. 4 episodes of SVT, fastest at 194 bpm for 4 beats, longest for 17.3 secs at 120 bpm. 0 episodes of VT Isolated SVE <1.0%, Couplet SVE <1.0%, Triplet SVE 0 Isolated VE <1.0%, Couplet VE 0, Triplet VE 0 No atrial fibrillation/atrial flutter/SVT/VT/high grade AV block, sinus pause >3sec noted.   Zio Patch Extended Out Patient EKG monitoring 13 days starting 09/06/2022:   Dominant rhythm sinus rhythm. HR 58-194 bpm. Avg HR 78 bpm. 4 episodes of SVT, fastest at 194 bpm for 4 beats, longest for 17.3 secs at 120 bpm. 0 episodes of VT Isolated SVE <1.0%, Couplet SVE  <1.0%, Triplet SVE 0 Isolated VE <1.0%, Couplet VE 0, Triplet VE 0 No atrial fibrillation/atrial flutter/SVT/VT/high grade AV block, sinus pause >3sec noted.   Echocardiogram 06/29/2022: 1. Left ventricular ejection fraction, by estimation, is 60 to 65%. The  left ventricle has normal function. The left ventricle has no regional  wall motion abnormalities. There is moderate left ventricular hypertrophy.  Left ventricular diastolic  parameters are consistent with Grade I diastolic dysfunction (impaired  relaxation).   2. Right ventricular systolic function is normal. The right ventricular  size is normal. There is normal pulmonary artery systolic pressure.   3. The mitral valve is normal in structure. No evidence of mitral valve  regurgitation. No evidence of mitral stenosis.   4. The tricuspid valve is abnormal.   5. The aortic valve is tricuspid. Aortic valve regurgitation is not  visualized. No aortic stenosis is present.   6. IVC is small suggesting low RA pressure and hypovolemia.   Coronary angiogram 05/01/2016: Normal LV systolic function, EF 55-60%. No significant coronary artery disease. Right dominant separation. Minimal luminal irregularity evident in the LAD and also RCA.      Physical Exam:   Physical Exam Vitals and nursing note reviewed.  Constitutional:      General: He is not in acute distress. Neck:     Vascular: No JVD.  Cardiovascular:  Rate and Rhythm: Normal rate and regular rhythm.     Heart sounds: Normal heart sounds. No murmur heard. Pulmonary:     Effort: Pulmonary effort is normal.     Breath sounds: Normal breath sounds. No wheezing or rales.     Comments: Slight wheezing Musculoskeletal:     Right lower leg: No edema.     Left lower leg: No edema.      VISIT DIAGNOSES:   ICD-10-CM   1. Primary hypertension  I10     2. Mixed hyperlipidemia  E78.2 Bempedoic Acid -Ezetimibe  (NEXLIZET ) 180-10 MG TABS    3. PSVT (paroxysmal supraventricular  tachycardia) (HCC)  I47.10     4. 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 Myalgia due to statin  M79.10 Bempedoic Acid -Ezetimibe  (NEXLIZET ) 180-10 MG TABS   T46.6X5A     6. Orthopnea  R06.01 ECHOCARDIOGRAM COMPLETE        ASSESSMENT AND PLAN: .    Eric Glass is a 63 y.o. male with hypertension, hyperlipidemia, type 2 DM, CKD 3B, h/o stroke 2016, 2020, OSA not on CPAP, asthma, pSVT  Statin intolerance: Statin myalgias, as well as rectal dysfunction the patient attributes to statin, as it started since changed from Lipitor to Crestor .  Therefore, he would like to try nonstatin therapy.  Stop Crestor , will get prior authorization for Nexlizet  at 180-10 mg daily given his history of stroke, and statin myalgias with 2 different statins.  If erectile dysfunction does not improve even after stopping statin, next possible culprit could be metoprolol , that he is on for PSVT.  Will try to wean him off metoprolol  in that case.  Orthopnea: Appears euvolemic on exam, but has had shortness of breath at night.  Will check echocardiogram.  Hypertension: Well-controlled on current antihypertensive therapy.  No changes made today.  Mixed hyperlipidemia: Recommendations as above.   Meds ordered this encounter  Medications   Bempedoic Acid -Ezetimibe  (NEXLIZET ) 180-10 MG TABS    Sig: Take 1 tablet by mouth daily.    Dispense:  30 tablet    Refill:  3     F/u in 6 months  Signed, Newman JINNY Lawrence, MD

## 2024-05-15 ENCOUNTER — Ambulatory Visit (HOSPITAL_COMMUNITY)
Admission: RE | Admit: 2024-05-15 | Discharge: 2024-05-15 | Disposition: A | Discharge status: Home / Self Care | Source: Ambulatory Visit | Attending: Cardiology | Admitting: Cardiology

## 2024-05-15 ENCOUNTER — Ambulatory Visit: Payer: Self-pay | Admitting: Cardiology

## 2024-05-15 DIAGNOSIS — R0601 Orthopnea: Secondary | ICD-10-CM | POA: Insufficient documentation

## 2024-05-15 LAB — ECHOCARDIOGRAM COMPLETE
AR max vel: 5.14 cm2
AV Area VTI: 4.52 cm2
AV Area mean vel: 4.2 cm2
AV Mean grad: 3.3 mmHg
AV Peak grad: 5.1 mmHg
Ao pk vel: 1.13 m/s
Area-P 1/2: 2.74 cm2
Calc EF: 51.9 %
S' Lateral: 3.3 cm
Single Plane A2C EF: 47.4 %
Single Plane A4C EF: 56.1 %

## 2024-05-15 NOTE — Progress Notes (Signed)
 Low normal pumping function of the heart. No severe heart valve abnormalities noted.  If shortness of breath at night persist, recommend starting Lasix for milligram daily. Will discuss further during office visit in November.  Thanks MJP

## 2024-05-18 ENCOUNTER — Telehealth: Payer: Self-pay | Admitting: Cardiology

## 2024-05-18 DIAGNOSIS — E782 Mixed hyperlipidemia: Secondary | ICD-10-CM

## 2024-05-18 DIAGNOSIS — I63311 Cerebral infarction due to thrombosis of right middle cerebral artery: Secondary | ICD-10-CM

## 2024-05-18 DIAGNOSIS — T466X5A Adverse effect of antihyperlipidemic and antiarteriosclerotic drugs, initial encounter: Secondary | ICD-10-CM

## 2024-05-18 MED ORDER — NEXLIZET 180-10 MG PO TABS: 1.0000 | ORAL_TABLET | Freq: Every day | ORAL | 3 refills | Status: AC

## 2024-05-18 NOTE — Telephone Encounter (Signed)
 Pt's medication was sent to pt's pharmacy as requested. Confirmation received.

## 2024-05-18 NOTE — Telephone Encounter (Signed)
*  STAT* If patient is at the pharmacy, call can be transferred to refill team.   1. Which medications need to be refilled? (please list name of each medication and dose if known)  Bempedoic Acid -Ezetimibe  (NEXLIZET ) 180-10 MG TABS    2. Would you like to learn more about the convenience, safety, & potential cost savings by using the [facility]?      3. Are you open to using the [facility] (Type [facility].  ).   4. Which pharmacy/location (including street and city if local pharmacy) is medication to be sent to? [facility] - [place], [place] - [address]    5. Do they need a 30 day or 90 day supply? 30 day

## 2024-07-03 ENCOUNTER — Encounter: Payer: Self-pay | Admitting: Urology

## 2024-07-03 ENCOUNTER — Ambulatory Visit: Admitting: Urology

## 2024-07-03 VITALS — BP 127/70 | HR 81

## 2024-07-03 DIAGNOSIS — N486 Induration penis plastica: Secondary | ICD-10-CM

## 2024-07-03 MED ORDER — PENTOXIFYLLINE ER 400 MG PO TBCR: 400.0000 mg | EXTENDED_RELEASE_TABLET | Freq: Two times a day (BID) | ORAL | 4 refills | Status: AC

## 2024-07-03 NOTE — Patient Instructions (Signed)
 Collagenase Injection (Dupuytren Contracture/Peyronie Disease) What is this medication? COLLAGENASE (kohl LAH jen ace) treats conditions caused by thickening of tissue in your body. It works by breaking down excess collagen in the tissue, which reduces stiffness and tightness. This medicine may be used for other purposes; ask your health care provider or pharmacist if you have questions. COMMON BRAND NAME(S): Xiaflex What should I tell my care team before I take this medication? They need to know if you have any of these conditions: Bleeding disorder An unusual or allergic reaction to collagenase, other medications, foods, dyes, or preservatives Pregnant or trying to get pregnant Breast-feeding How should I use this medication? This medication is injected into the affected area. It is given by your care team in a hospital or clinic setting. A special MedGuide will be given to you by the pharmacist with each prescription and refill. Be sure to read this information carefully each time. Talk to your care team about the use of this medication in children. Special care may be needed. Overdosage: If you think you have taken too much of this medicine contact a poison control center or emergency room at once. NOTE: This medicine is only for you. Do not share this medicine with others. What if I miss a dose? Keep appointments for follow-up doses. It is important not to miss your dose. Call your care team if you are unable to keep an appointment. What may interact with this medication? Aspirin and aspirin-like medications Certain medications that treat or prevent blood clots, such as warfarin, enoxaparin, dalteparin, apixaban, dabigatran, rivaroxaban This list may not describe all possible interactions. Give your health care provider a list of all the medicines, herbs, non-prescription drugs, or dietary supplements you use. Also tell them if you smoke, drink alcohol, or use illegal drugs. Some items may  interact with your medicine. What should I watch for while using this medication? Your condition will be monitored carefully while you are receiving this medication. If medication is for Dupuytren's Contracture, visit your care team 1 to 3 days after the injection. Until you visit your care team, do not flex or extend the fingers of your hand that was injected. Do not touch your finger that was injected. Elevate your hand until bedtime. Do not perform activity with the injected hand until you are told that it is ok. Follow any instructions about wearing a splint or performing finger exercises. Contact your care team as soon as possible if you get increasing redness or swelling in the hand, have numbness or tingling in the treated finger, or have trouble bending the finger after the swelling goes down. If medication is for Peyronie's disease, do not have sex between the first and second injections. Wait 4 weeks after the second injection and when there is no more pain or swelling in the penis to have sex. Avoid using vacuum erection devices during treatment with this medication. Try to avoid straining stomach muscles such as during bowel movements. Your care team will give you instructions on how to perform modeling activities at home. Contact your care team as soon as possible if you have severe pain or swelling in the penis, severe purple bruising and swelling of the penis, trouble passing urine, blood in urine, popping or cracking sound form the penis, or sudden loss of ability to maintain an erection. What side effects may I notice from receiving this medication? Side effects that you should report to your care team as soon as possible: Allergic reactions--skin rash,  itching, hives, swelling of the face, lips, tongue, or throat Feeling faint or lightheaded Skin infection--skin redness, swelling, warmth, or pain Severe back pain, chest pain, headache, trouble breathing after injection Snap or pop that  you feel or hear, severe pain, numbness, swelling, or bruising of or trouble moving in area where injected Side effects that usually do not require medical attention (report to your care team if they continue or are bothersome): Pain, redness, or irritation at injection site This list may not describe all possible side effects. Call your doctor for medical advice about side effects. You may report side effects to FDA at 1-800-FDA-1088. Where should I keep my medication? This medication is given in a hospital or clinic. It will not be stored at home. NOTE: This sheet is a summary. It may not cover all possible information. If you have questions about this medicine, talk to your doctor, pharmacist, or health care provider.  2024 Elsevier/Gold Standard (2021-07-28 00:00:00)

## 2024-07-03 NOTE — Progress Notes (Signed)
 07/03/2024 10:21 AM   Eric Glass 21-Feb-1961 [id number]  Referring provider: Rosan Jacquline NOVAK, NP [address] [place],  [place] [address]  Penile curvature   HPI: Eric Glass is a 63yo here for evaluation of penile curvature. He noticed ventral curvature starting in July 2025. He does not recall an injury. He has pain with his erections. He has issues maintaining the erection. He has had a weaker urinary stream after he noticed the curve. The curvature has been stable for the past 3 months.    PMH: Past Medical History:  Diagnosis Date   Arthritis    Chest pain    CVA (cerebral infarction)    Diabetes mellitus without complication (HCC)    Headache    Hypertension    Neck pain    Stroke [facility])     Surgical History: Past Surgical History:  Procedure Laterality Date   CARDIAC CATHETERIZATION N/A 05/01/2016   Procedure: Left Heart Cath and Coronary Angiography;  Surgeon: Gordy Bergamo, MD;  Location: [facility] INVASIVE CV LAB;  Service: Cardiovascular;  Laterality: N/A;   rotator cuff replacement Left     Home Medications:  Allergies as of 07/03/2024       Reactions   Bee Venom Anaphylaxis   EPIPEN required   Contrast Media [iodinated Contrast Media] Nausea Only        Medication List        Accurate as of July 03, 2024 10:21 AM. If you have any questions, ask your nurse or doctor.          Accu-Chek Aviva device Use as instructed What changed:  how much to take how to take this when to take this additional instructions   albuterol 108 (90 Base) MCG/ACT inhaler Commonly known as: VENTOLIN HFA Inhale into the lungs every 6 (six) hours as needed for wheezing or shortness of breath.   alfuzosin  10 MG 24 hr tablet Commonly known as: UROXATRAL  Take 10 mg by mouth daily with breakfast.   amLODipine  10 MG tablet Commonly known as: NORVASC  Take 1 tablet (10 mg total) by mouth daily.   cetirizine 10 MG tablet Commonly known as: ZYRTEC Take 10 mg by mouth  daily.   clopidogrel  75 MG tablet Commonly known as: PLAVIX  TAKE ONE TABLET BY MOUTH ONCE DAILY   dutasteride 0.5 MG capsule Commonly known as: AVODART Take 0.5 mg by mouth daily.   glipiZIDE  5 MG tablet Commonly known as: GLUCOTROL  Take 1 tablet (5 mg total) by mouth 2 (two) times daily before a meal.   glucose blood test strip Commonly known as: Accu-Chek Aviva Use to test blood glucose 2 times a day. What changed:  how much to take how to take this when to take this additional instructions   hydrALAZINE  50 MG tablet Commonly known as: APRESOLINE  Take 50 mg by mouth 3 (three) times daily.   ibuprofen  800 MG tablet Commonly known as: ADVIL  Take 800 mg by mouth daily as needed.   isosorbide  dinitrate 30 MG tablet Commonly known as: ISORDIL  TAKE 1 TABLET BY MOUTH THREE TIMES DAILY   Januvia 100 MG tablet Generic drug: sitaGLIPtin Take 100 mg by mouth daily.   Jardiance 25 MG Tabs tablet Generic drug: empagliflozin Take 25 mg by mouth daily.   levocetirizine 5 MG tablet Commonly known as: XYZAL Take 5 mg by mouth daily.   levothyroxine  150 MCG tablet Commonly known as: SYNTHROID  Take 150 mcg by mouth daily.   metFORMIN  500 MG tablet Commonly known  as: GLUCOPHAGE  Take 1 tablet (500 mg total) by mouth 2 (two) times daily with a meal.   metoprolol  succinate 100 MG 24 hr tablet Commonly known as: TOPROL -XL Take 100 mg by mouth daily.   Nasacort Allergy 24HR Children 55 MCG/ACT Aero nasal inhaler Generic drug: triamcinolone Place 2 sprays into the nose daily.   Nexlizet  180-10 MG Tabs Generic drug: Bempedoic Acid -Ezetimibe  Take 1 tablet by mouth daily.   oxybutynin 5 MG tablet Commonly known as: DITROPAN Take 5 mg by mouth 3 (three) times daily.   pantoprazole  40 MG tablet Commonly known as: PROTONIX  Take 1 tablet (40 mg total) by mouth daily.   potassium chloride  10 MEQ CR capsule Commonly known as: MICRO-K  Take 1 capsule (10 mEq total) by mouth  daily.   rosuvastatin  20 MG tablet Commonly known as: Crestor  Take 1/2 tablet (10 mg total) by mouth daily x 2 weeks and then increase to 1 tablet (20 mg total) by mouth daily there after        Allergies:  Allergies  Allergen Reactions   Bee Venom Anaphylaxis    EPIPEN required   Contrast Media [Iodinated Contrast Media] Nausea Only    Family History: Family History  Problem Relation Age of Onset   Stroke Father    Heart disease Father    Seizures Paternal Uncle     Social History:  reports that he quit smoking about 9 years ago. His smoking use included cigarettes. He has never used smokeless tobacco. He reports that he does not drink alcohol and does not use drugs.  ROS: All other review of systems were reviewed and are negative except what is noted above in HPI  Physical Exam: BP 127/70   Pulse 81   Constitutional:  Alert and oriented, No acute distress. HEENT: [place] AT, moist mucus membranes.  Trachea midline, no masses. Cardiovascular: No clubbing, cyanosis, or edema. Respiratory: Normal respiratory effort, no increased work of breathing. GI: Abdomen is soft, nontender, nondistended, no abdominal masses GU: No CVA tenderness.  Ventral penile shaft peyronies plaque 1.5cm Lymph: No cervical or inguinal lymphadenopathy. Skin: No rashes, bruises or suspicious lesions. Neurologic: Grossly intact, no focal deficits, moving all 4 extremities. Psychiatric: Normal mood and affect.  Laboratory Data: Lab Results  Component Value Date   WBC 6.9 06/29/2022   HGB 12.0 (L) 06/29/2022   HCT 38.1 (L) 06/29/2022   MCV 83.2 06/29/2022   PLT 243 06/29/2022    Lab Results  Component Value Date   CREATININE 1.45 (H) 04/12/2023    No results found for: PSA  No results found for: TESTOSTERONE  Lab Results  Component Value Date   HGBA1C 7.7 (H) 06/29/2022    Urinalysis    Component Value Date/Time   COLORURINE YELLOW 06/28/2022 2038   APPEARANCEUR CLEAR  06/28/2022 2038   LABSPEC 1.023 06/28/2022 2038   PHURINE 6.0 06/28/2022 2038   GLUCOSEU >=500 (A) 06/28/2022 2038   HGBUR NEGATIVE 06/28/2022 2038   BILIRUBINUR NEGATIVE 06/28/2022 2038   KETONESUR NEGATIVE 06/28/2022 2038   PROTEINUR 30 (A) 06/28/2022 2038   NITRITE NEGATIVE 06/28/2022 2038   LEUKOCYTESUR NEGATIVE 06/28/2022 2038    Lab Results  Component Value Date   BACTERIA RARE (A) 06/28/2022    Pertinent Imaging:  Results for orders placed in visit on 10/07/00  DG Abd 1 View  Narrative FINDINGS CLINICAL:  F/U LEFT URETERAL KIDNEY STONE. ABDOMEN ONE VIEW: SUPINE FILM OF THE ABDOMEN AND A SUPINE FILM CENTERED OVER THE KIDNEYS ARE  COMPARED WITH A CT SCAN OF THE ABDOMEN AND PELVIS PERFORMED AT [facility]. ON 10/01/00.  THE CT SHOWED A SMALL STONE IN THE COURSE OF THE LEFT URETER IN THE LOWER LUMBAR REGION.  THE CT ALSO SHOWS SEVERAL PHLEBOLITHS IN THE LEFT BONY PELVIS. CURRENT FILMS SHOW NO DEFINITE STONES IN THE REGION OF THE KIDNEYS NOR IN THE ABDOMINAL PORTION OF THE URETERS.  IN THE LEFT SIDE OF THE BONY PELVIS THERE IS A CALCIFICATION THAT COULD BE A STONE IN THE LEFT UVJ VS. A PHLEBOLITH.  THE REMAINDER OF THE ABDOMEN IS UNREMARKABLE. IMPRESSION STONE IN THE LEFT BONY PELVIS THAT COULD BE IN THE LEFT UVJ.  PREVIOUSLY NOTED STONE IN THE LOWER LUMBAR PORTION OF THE LEFT URETER IS NO LONGER EVIDENT.  No results found for this or any previous visit.  No results found for this or any previous visit.  No results found for this or any previous visit.  No results found for this or any previous visit.  No results found for this or any previous visit.  No results found for this or any previous visit.  No results found for this or any previous visit.   Assessment & Plan:    1. Peyronie disease (Primary) We discussed the management of peyronies disease including medical therapy, penile plication, verapamil  therapy and xiaflex therapy.  After discussed the options the patient elects for medical therapy since he is in the active phase of peyronies disease. I will see him back in 3 months. We will trial trental 400mg  BID for 3 months    No follow-ups on file.  Belvie Clara, MD  [facility] [place]

## 2024-07-21 ENCOUNTER — Ambulatory Visit: Attending: Cardiology | Admitting: Cardiology

## 2024-07-21 ENCOUNTER — Ambulatory Visit: Admitting: Cardiology

## 2024-07-21 ENCOUNTER — Encounter: Payer: Self-pay | Admitting: Cardiology

## 2024-07-21 VITALS — BP 128/73 | HR 79 | Ht 72.0 in | Wt 230.0 lb

## 2024-07-21 DIAGNOSIS — E782 Mixed hyperlipidemia: Secondary | ICD-10-CM | POA: Diagnosis not present

## 2024-07-21 DIAGNOSIS — I10 Essential (primary) hypertension: Secondary | ICD-10-CM | POA: Diagnosis not present

## 2024-07-21 DIAGNOSIS — R0601 Orthopnea: Secondary | ICD-10-CM

## 2024-07-21 DIAGNOSIS — R0683 Snoring: Secondary | ICD-10-CM

## 2024-07-21 DIAGNOSIS — I471 Supraventricular tachycardia, unspecified: Secondary | ICD-10-CM

## 2024-07-21 NOTE — Progress Notes (Addendum)
 Cardiology Office Note:  .   Date:  07/21/2024  ID:  Eric Glass, DOB 08-02-1961, MRN [id number] PCP: Eric Leta NOVAK, MD  [facility] Providers Cardiologist:  Eric Lawrence, MD PCP: Eric Leta NOVAK, MD  Chief Complaint  Patient presents with   Dyspnea on exertion      History of Present Illness: .    Eric Glass is a 63 y.o. male with hypertension, hyperlipidemia, type 2 DM, CKD 3B, h/o stroke 2016, 2020, OSA not on CPAP, asthma, PSVT, dyspnea on exertion  Patient reports unchanged mild exertional dyspnea, as well as orthopnea.  Denies any leg edema.  Reviewed recent echocardiogram results with the patient, details below.    Patient is tolerating Nexlizet .  He is seeing urology for erectile dysfunction and is now on pentoxifylline .  Vitals:   07/21/24 0904  BP: 128/73  Pulse: 79  SpO2: 98%       ROS:  Review of Systems  Cardiovascular:  Positive for orthopnea. Negative for chest pain, dyspnea on exertion, leg swelling, palpitations and syncope.  Musculoskeletal:  Positive for myalgias.     Studies Reviewed: SABRA        EKG 07/21/2024: Normal sinus rhythm Nonspecific T wave abnormality When compared with ECG of 28-Jun-2022 16:29, Questionable change in QRS axis T wave inversion no longer evident in Inferior leads QT has shortened  Echocardiogram 04/2024:  1. Left ventricular ejection fraction, by estimation, is 50 to 55%. The  left ventricle has low normal function. The left ventricle has no regional  wall motion abnormalities. There is moderate left ventricular hypertrophy.  Left ventricular diastolic  parameters are consistent with Grade I diastolic dysfunction (impaired  relaxation). The average left ventricular global longitudinal strain is  -17.0 %. The global longitudinal strain is normal.   2. Right ventricular systolic function is normal. The right ventricular  size is normal.   3. The mitral valve is normal in structure. No evidence  of mitral valve  regurgitation. No evidence of mitral stenosis.   4. The tricuspid valve is abnormal.   5. The aortic valve is tricuspid. Aortic valve regurgitation is not  visualized. No aortic stenosis is present.   6. Aortic dilatation noted. There is mild dilatation of the ascending  aorta, measuring 38 mm.   7. The inferior vena cava is normal in size with greater than 50%  respiratory variability, suggesting right atrial pressure of 3 mmHg.   Echocardiogram 06/2022: EF 60-65%  Zio Patch Extended Out Patient EKG monitoring 13 days starting 09/06/2022:   Dominant rhythm sinus rhythm. HR 58-194 bpm. Avg HR 78 bpm. 4 episodes of SVT, fastest at 194 bpm for 4 beats, longest for 17.3 secs at 120 bpm. 0 episodes of VT Isolated SVE <1.0%, Couplet SVE <1.0%, Triplet SVE 0 Isolated VE <1.0%, Couplet VE 0, Triplet VE 0 No atrial fibrillation/atrial flutter/SVT/VT/high grade AV block, sinus pause >3sec noted.   Coronary angiogram 05/01/2016: Normal LV systolic function, EF 55-60%. No significant coronary artery disease. Right dominant separation. Minimal luminal irregularity evident in the LAD and also RCA.    Labs 01/2024: Chol 130, TG 190, HDL 25, LDL 73 HbA1C 6.9%  03/2023: Chol 140, TG 151, HDL 29, LDL 84 HbA1C 7.5% Hb 12.8 Cr 1.45, K 4.1  Physical Exam:   Physical Exam Vitals and nursing note reviewed.  Constitutional:      General: He is not in acute distress. Neck:     Vascular: No JVD.  Cardiovascular:  Rate and Rhythm: Normal rate and regular rhythm.     Heart sounds: Normal heart sounds. No murmur heard. Pulmonary:     Effort: Pulmonary effort is normal.     Breath sounds: Normal breath sounds. No wheezing or rales.     Comments: Slight wheezing Musculoskeletal:     Right lower leg: No edema.     Left lower leg: No edema.      VISIT DIAGNOSES:   ICD-10-CM   1. Mixed hyperlipidemia  E78.2 EKG 12-Lead    Lipid panel    2. PSVT (paroxysmal supraventricular  tachycardia)  I47.10 EKG 12-Lead    3. Essential hypertension  I10 EKG 12-Lead    4. Orthopnea  R06.01 Basic metabolic panel with GFR    Pro b natriuretic peptide (BNP)    5. Snoring  R06.83 [facility] Sleep Study        ASSESSMENT AND PLAN: .    Eric Glass is a 63 y.o. male with hypertension, hyperlipidemia, type 2 DM, CKD 3B, h/o stroke 2016, 2020, OSA not on CPAP, asthma, pSVT  Statin intolerance: Statin myalgias, as well as erectile dysfunction the patient attributes to statin. Now tolerating Nexlizet . Check lipid panel.   Dyspnea on exertion, orthopnea: Low normal LVEF 50 to 55%.  Physical exam does not suggest volume overload. Check BMP, proBNP. If proBNP is elevated, we will start with adding Lasix. If creatinine will allow, we will consider coronary CT angiogram to rule out obstructive CAD as cause, although he does not have clear anginal symptoms. If creatinine is elevated, will consider Lexiscan nuclear stress test.  Hypertension: Well-controlled on current antihypertensive therapy.  No changes made today.  Mixed hyperlipidemia: Recommendations as above.  PSVT: Continue metoprolol .  F/u in 3 months  Signed, Eric JINNY Lawrence, MD

## 2024-07-21 NOTE — Patient Instructions (Signed)
  Lab Work: Bmp Lipid panel  Probnp  If you have labs (blood work) drawn today and your tests are completely normal, you will receive your results only by: MyChart Message (if you have MyChart) OR A paper copy in the mail If you have any lab test that is abnormal or we need to change your treatment, we will call you to review the results.  Testing/Procedures: [place]   Your physician has recommended that you have a sleep study. This test records several body functions during sleep, including: brain activity, eye movement, oxygen and carbon dioxide blood levels, heart rate and rhythm, breathing rate and rhythm, the flow of air through your mouth and nose, snoring, body muscle movements, and chest and belly movement.   Follow-Up: At [facility], you and your health needs are our priority.  As part of our continuing mission to provide you with exceptional heart care, our providers are all part of one team.  This team includes your primary Cardiologist (physician) and Advanced Practice Providers or APPs (Physician Assistants and Nurse Practitioners) who all work together to provide you with the care you need, when you need it.  Your next appointment:   3 month(s)  Provider:   Newman JINNY Lawrence, MD

## 2024-07-22 ENCOUNTER — Ambulatory Visit: Payer: Self-pay | Admitting: Cardiology

## 2024-07-22 DIAGNOSIS — Z789 Other specified health status: Secondary | ICD-10-CM

## 2024-07-22 LAB — BASIC METABOLIC PANEL WITH GFR
BUN/Creatinine Ratio: 11 (ref 10–24)
BUN: 17 mg/dL (ref 8–27)
CO2: 19 mmol/L — ABNORMAL LOW (ref 20–29)
Calcium: 10 mg/dL (ref 8.6–10.2)
Chloride: 101 mmol/L (ref 96–106)
Creatinine, Ser: 1.57 mg/dL — ABNORMAL HIGH (ref 0.76–1.27)
Glucose: 167 mg/dL — ABNORMAL HIGH (ref 70–99)
Potassium: 4.2 mmol/L (ref 3.5–5.2)
Sodium: 139 mmol/L (ref 134–144)
eGFR: 50 mL/min/1.73 — ABNORMAL LOW (ref >59)

## 2024-07-22 LAB — LIPID PANEL
Chol/HDL Ratio: 6 ratio — ABNORMAL HIGH (ref 0.0–5.0)
Cholesterol, Total: 145 mg/dL (ref 100–199)
HDL: 24 mg/dL — ABNORMAL LOW (ref >39)
LDL Chol Calc (NIH): 88 mg/dL (ref 0–99)
Triglycerides: 189 mg/dL — ABNORMAL HIGH (ref 0–149)
VLDL Cholesterol Cal: 33 mg/dL (ref 5–40)

## 2024-07-22 LAB — PRO B NATRIURETIC PEPTIDE: NT-Pro BNP: <36 pg/mL (ref 0–210)

## 2024-07-22 NOTE — Progress Notes (Signed)
 Cholesterol not at goal of LDL <70 on Nexlizet .  Did not tolerate statin due to side effects. If interested, referred to lipid clinic for consideration for injectable agents.  Thanks MJP

## 2024-07-27 NOTE — Progress Notes (Signed)
 MyChart message containing providers result note and interpretation read by patient: Last read by Christopher DELENA Mirza at 4:39AM on 07/25/2024.

## 2024-08-31 ENCOUNTER — Encounter (HOSPITAL_BASED_OUTPATIENT_CLINIC_OR_DEPARTMENT_OTHER): Payer: Self-pay | Admitting: Cardiology

## 2024-08-31 DIAGNOSIS — G4733 Obstructive sleep apnea (adult) (pediatric): Secondary | ICD-10-CM | POA: Diagnosis not present

## 2024-09-01 NOTE — Procedures (Signed)
" ° ° ° °  SLEEP STUDY REPORT Patient Information Study Date: 08/31/2024 Patient Name: Eric Glass Patient ID: [id number] Birth Date: [birth year] Age: 64 Gender: Male BMI: 31.1 (W=229 lb, H=6' 0'') Referring Physician: Newman Lawrence, MD  TEST DESCRIPTION: Home sleep apnea testing was completed using the WatchPat, a Type 1 device, utilizing  peripheral arterial tonometry (PAT), chest movement, actigraphy, pulse oximetry, pulse rate, body position and snore.  AHI was calculated with apnea and hypopnea using valid sleep time as the denominator. RDI includes apneas,  hypopneas, and RERAs. The data acquired and the scoring of sleep and all associated events were performed in  accordance with the recommended standards and specifications as outlined in the AASM Manual for the Scoring of  Sleep and Associated Events 2.2.0 (2015).   FINDINGS:   1. Mild Obstructive Sleep Apnea with AHI 11.4/hr.   2. No Central Sleep Apnea with pAHIc 2.5/hr.   3. Oxygen desaturations as low as 89%.   4. Moderate snoring was present. O2 sats were < 88% for 0 min.   5. Total sleep time was 7 hrs and 39 min.   6. 16.1% of total sleep time was spent in REM sleep.   7. Normal sleep onset latency at 16 min.   8. Shortened REM sleep onset latency at 78 min.   9. Total awakenings were 10.  10. Arrhythmia detection: None  DIAGNOSIS: Mild Obstructive Sleep Apnea (G47.33)  RECOMMENDATIONS: 1. Clinical correlation of these findings is necessary. The decision to treat obstructive sleep apnea (OSA) is usually  based on the presence of apnea symptoms or the presence of associated medical conditions such as Hypertension,  Congestive Heart Failure, Atrial Fibrillation or Obesity. The most common symptoms of OSA are snoring, gasping for  breath while sleeping, daytime sleepiness and fatigue.  2. Initiating apnea therapy is recommended given the presence of symptoms and/or associated conditions.  Recommend proceeding with  one of the following:  a. Auto-CPAP therapy with a pressure range of 5-20cm H2O.  b. An oral appliance (OA) that can be obtained from certain dentists with expertise in sleep medicine. These are  primarily of use in non-obese patients with mild and moderate disease.  c. An ENT consultation which may be useful to look for specific causes of obstruction and possible treatment  options.  d. If patient is intolerant to PAP therapy, consider referral to ENT for evaluation for hypoglossal nerve stimulator.  3. Close follow-up is necessary to ensure success with CPAP or oral appliance therapy for maximum benefit . 4. A follow-up oximetry study on CPAP is recommended to assess the adequacy of therapy and determine the need  for supplemental oxygen or the potential need for Bi-level therapy. An arterial blood gas to determine the adequacy of  baseline ventilation and oxygenation should also be considered. 5. Healthy sleep recommendations include: adequate nightly sleep (normal 7-9 hrs/night), avoidance of caffeine after  noon and alcohol near bedtime, and maintaining a sleep environment that is cool, dark and quiet. 6. Weight loss for overweight patients is recommended. Even modest amounts of weight loss can significantly  improve the severity of sleep apnea. 7. Snoring recommendations include: weight loss where appropriate, side sleeping, and avoidance of alcohol before  bed. 8. Operation of motor vehicle should be avoided when sleepy.  Signature: Wilbert Bihari, MD; [facility]; Diplomat, American Board of Sleep  Medicine Electronically Signed: 09/01/2024 11:38:24 AM "

## 2024-09-25 ENCOUNTER — Ambulatory Visit: Admitting: Pharmacist

## 2024-09-25 ENCOUNTER — Telehealth: Payer: Self-pay | Admitting: *Deleted

## 2024-09-25 DIAGNOSIS — G4733 Obstructive sleep apnea (adult) (pediatric): Secondary | ICD-10-CM

## 2024-09-25 DIAGNOSIS — I10 Essential (primary) hypertension: Secondary | ICD-10-CM

## 2024-09-25 DIAGNOSIS — R0683 Snoring: Secondary | ICD-10-CM

## 2024-09-25 NOTE — Telephone Encounter (Signed)
-----   Message from Wilbert Bihari, MD sent at 09/01/2024 11:39 AM EST ----- Please let patient know that they have sleep apnea and recommend treating with CPAP.  Please order an auto CPAP from 4-15cm H2O with heated humidity and mask of choice.  Order overnight pulse ox on CPAP.  Followup with me in 6 weeks.

## 2024-09-25 NOTE — Telephone Encounter (Signed)
 The patient has been notified of the result and verbalized understanding.  All questions (if any) were answered. Eric Glass, CMA 09/25/2024 9:43 AM     Upon patient request DME selection is [facility] Patient understands he will be contacted by [facility] to set up his cpap. Patient understands to call if [facility] does not contact him with new setup in a timely manner. Patient understands they will be called once confirmation has been received from [facility] that they have received their new machine to schedule 10 week follow up appointment.   [facility] notified of new cpap order  Please add to airview Patient was grateful for the call and thanked me.

## 2024-10-23 ENCOUNTER — Ambulatory Visit: Admitting: Cardiology

## 2024-10-28 ENCOUNTER — Ambulatory Visit: Admitting: Urology

## 2024-10-30 ENCOUNTER — Ambulatory Visit: Admitting: Pharmacist
